# Patient Record
Sex: Male | Born: 1965 | Race: Black or African American | Hispanic: No | Marital: Single | Smoking: Never smoker
Health system: Southern US, Community
[De-identification: ages and names within clinical notes are randomized; demographics above are authoritative.]

## PROBLEM LIST (undated history)

## (undated) DIAGNOSIS — C9590 Leukemia, unspecified not having achieved remission: Secondary | ICD-10-CM

---

## 2021-01-29 ENCOUNTER — Emergency Department (HOSPITAL_COMMUNITY)
Admission: EM | Admit: 2021-01-29 | Discharge: 2021-01-30 | Disposition: A | Discharge status: Home / Self Care | Payer: BLUE CROSS/BLUE SHIELD | Attending: Emergency Medicine | Admitting: Emergency Medicine

## 2021-01-29 DIAGNOSIS — M545 Low back pain, unspecified: Secondary | ICD-10-CM | POA: Diagnosis not present

## 2021-01-29 DIAGNOSIS — M25532 Pain in left wrist: Secondary | ICD-10-CM
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
## 2021-01-30 ENCOUNTER — Other Ambulatory Visit: Payer: Self-pay

## 2021-01-30 ENCOUNTER — Emergency Department (HOSPITAL_COMMUNITY): Payer: BLUE CROSS/BLUE SHIELD
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
## 2021-01-31 ENCOUNTER — Other Ambulatory Visit: Payer: Self-pay

## 2021-01-31 ENCOUNTER — Emergency Department (HOSPITAL_BASED_OUTPATIENT_CLINIC_OR_DEPARTMENT_OTHER)
Admission: EM | Admit: 2021-01-31 | Discharge: 2021-02-01 | Disposition: A | Discharge status: Home / Self Care | Payer: BLUE CROSS/BLUE SHIELD | Attending: Emergency Medicine | Admitting: Emergency Medicine

## 2021-01-31 ENCOUNTER — Encounter (HOSPITAL_BASED_OUTPATIENT_CLINIC_OR_DEPARTMENT_OTHER): Payer: Self-pay

## 2021-01-31 DIAGNOSIS — M545 Low back pain, unspecified: Secondary | ICD-10-CM | POA: Diagnosis present

## 2021-01-31 DIAGNOSIS — Z856 Personal history of leukemia: Secondary | ICD-10-CM | POA: Insufficient documentation

## 2021-01-31 HISTORY — DX: Leukemia, unspecified not having achieved remission: C95.90

## 2021-01-31 MED ORDER — KETOROLAC TROMETHAMINE 30 MG/ML IJ SOLN
30.0000 mg | Freq: Once | INTRAMUSCULAR | Status: DC
  Filled 2021-01-31: qty 1

## 2021-01-31 MED ORDER — KETOROLAC TROMETHAMINE 30 MG/ML IJ SOLN
30.0000 mg | Freq: Once | INTRAMUSCULAR | Status: AC
  Administered 2021-01-31: 30 mg via INTRAMUSCULAR

## 2021-01-31 MED ORDER — METHOCARBAMOL 500 MG PO TABS
500.0000 mg | ORAL_TABLET | Freq: Once | ORAL | Status: AC
  Administered 2021-01-31: 500 mg via ORAL
  Filled 2021-01-31: qty 1

## 2021-01-31 NOTE — ED Notes (Signed)
ED Provider at bedside. MD

## 2021-01-31 NOTE — ED Triage Notes (Signed)
Pt. States his back is "locked up" Bilat low back pain while at the grocery store tonight. Has Hx of previous back injury.

## 2021-02-01 NOTE — ED Provider Notes (Signed)
[facility] EMERGENCY DEPARTMENT Provider Note   CSN: [id number] Arrival date & time: 01/31/21  2337     History Chief Complaint  Patient presents with   Back Pain    Alvin Finley is a 55 y.o. male.   Back Pain Location:  Generalized Quality:  Aching Radiates to:  Does not radiate Pain severity:  Moderate Duration:  2 months Timing:  Intermittent Progression:  Waxing and waning Chronicity:  Chronic Context: not emotional stress   Relieved by:  Nothing Worsened by:  Nothing Ineffective treatments:  None tried     Past Medical History:  Diagnosis Date   Leukemia ([place])     There are no problems to display for this patient.   History reviewed. No pertinent surgical history.     No family history on file.  Social History   Tobacco Use   Smoking status: Never  Substance Use Topics   Alcohol use: Yes   Drug use: Not Currently    Home Medications Prior to Admission medications   Not on File    Allergies    Patient has no known allergies.  Review of Systems   Review of Systems  Musculoskeletal:  Positive for back pain.  All other systems reviewed and are negative.  Physical Exam Updated Vital Signs BP (!) 132/97   Pulse (!) 119   Temp 98.7 F (37.1 C) (Oral)   Resp 20   Ht '5\' 6"'$  (1.676 m)   Wt 81.6 kg   SpO2 97%   BMI 29.05 kg/m   Physical Exam Vitals and nursing note reviewed.  Constitutional:      Appearance: He is well-developed.  HENT:     Head: Normocephalic and atraumatic.     Mouth/Throat:     Mouth: Mucous membranes are moist.     Pharynx: Oropharynx is clear.  Eyes:     Pupils: Pupils are equal, round, and reactive to light.  Cardiovascular:     Rate and Rhythm: Normal rate.  Pulmonary:     Effort: Pulmonary effort is normal. No respiratory distress.  Abdominal:     General: There is no distension.  Musculoskeletal:        General: No swelling or tenderness. Normal range of motion.     Cervical back:  Normal range of motion.  Skin:    General: Skin is warm and dry.     Coloration: Skin is not pale.  Neurological:     General: No focal deficit present.     Mental Status: He is alert and oriented to person, place, and time.     Cranial Nerves: No cranial nerve deficit.     Sensory: No sensory deficit.     Motor: No weakness.     Coordination: Coordination normal.     Gait: Gait normal.     Deep Tendon Reflexes: Reflexes normal.    ED Results / Procedures / Treatments   Labs (all labs ordered are listed, but only abnormal results are displayed) Labs Reviewed - No data to display  EKG None  Radiology No results found.  Procedures Procedures   Medications Ordered in ED Medications  methocarbamol (ROBAXIN) tablet 500 mg (500 mg Oral Given 01/31/21 2355)  ketorolac (TORADOL) 30 MG/ML injection 30 mg (30 mg Intramuscular Given 01/31/21 2355)    ED Course  I have reviewed the triage vital signs and the nursing notes.  Pertinent labs & imaging results that were available during my care of the patient were  reviewed by me and considered in my medical decision making (see chart for details).    MDM Rules/Calculators/A&P                           Seen at multiple ED's in last few days with similar symptoms. No red flags to indicate imaging today. Is trying to move to [place], will get PCP and follow up there when he is able.   Final Clinical Impression(s) / ED Diagnoses Final diagnoses:  Acute bilateral low back pain, unspecified whether sciatica present    Rx / DC Orders ED Discharge Orders     None        Madaline Lefeber, Corene Cornea, MD 02/01/21 0710

## 2022-10-28 ENCOUNTER — Encounter: Payer: Self-pay | Admitting: Student

## 2022-10-28 ENCOUNTER — Ambulatory Visit (INDEPENDENT_AMBULATORY_CARE_PROVIDER_SITE_OTHER): Payer: Medicaid Other | Admitting: Student

## 2022-10-28 VITALS — BP 150/91 | HR 83 | Temp 98.4°F | Ht 65.0 in | Wt 218.8 lb

## 2022-10-28 DIAGNOSIS — Z7689 Persons encountering health services in other specified circumstances: Secondary | ICD-10-CM | POA: Diagnosis not present

## 2022-10-28 DIAGNOSIS — F1990 Other psychoactive substance use, unspecified, uncomplicated: Secondary | ICD-10-CM | POA: Diagnosis not present

## 2022-10-28 DIAGNOSIS — G8929 Other chronic pain: Secondary | ICD-10-CM

## 2022-10-28 DIAGNOSIS — C9110 Chronic lymphocytic leukemia of B-cell type not having achieved remission: Secondary | ICD-10-CM | POA: Diagnosis not present

## 2022-10-28 DIAGNOSIS — Z1211 Encounter for screening for malignant neoplasm of colon: Secondary | ICD-10-CM

## 2022-10-28 DIAGNOSIS — Z789 Other specified health status: Secondary | ICD-10-CM

## 2022-10-28 DIAGNOSIS — M542 Cervicalgia: Secondary | ICD-10-CM

## 2022-10-28 DIAGNOSIS — I10 Essential (primary) hypertension: Secondary | ICD-10-CM | POA: Diagnosis not present

## 2022-10-28 MED ORDER — AMLODIPINE BESYLATE 5 MG PO TABS: 5.0000 mg | ORAL_TABLET | Freq: Every day | ORAL | 11 refills | Status: DC

## 2022-10-28 NOTE — Patient Instructions (Signed)
Thank you, [name] for allowing [place] to provide your care today. Welcome to the Internal Medicine Center.   Neck Pain -we will repeat MRI of the cervical spine to check for changes -you may try over the counter tylenol to help with the pain -try to avoid ibuprofen as much as possible  -prescription for amlodipine 5 mg one time a day for your high blood pressure -blood work today  I have ordered the following labs for you:  Lab Orders         CMP14 + Anion Gap         CBC with Diff      I have ordered the following medication/changed the following medications:   Stop the following medications: There are no discontinued medications.   Start the following medications: Meds ordered this encounter  Medications   amLODipine (NORVASC) 5 MG tablet    Sig: Take 1 tablet (5 mg total) by mouth daily.    Dispense:  30 tablet    Refill:  11     Follow up:  1 month    Should you have any questions or concerns please call the internal medicine clinic at [phone].    Rana Snare, D.O. [facility]

## 2022-10-28 NOTE — Progress Notes (Signed)
CC: establish care, chronic back pain  HPI:  Alvin Finley is a 57 y.o. male living with a history stated below and presents today for establish care and chronic back pain. Please see problem based assessment and plan for additional details.  PMH: -CLL -HTN -Bipolar disorder  PSH: -left arm fracture repair 2017  Meds: -patient states he has not been on any medications for least 2 months No current outpatient medications on file prior to visit.   No current facility-administered medications on file prior to visit.   Fhx: -mother: unknown cancer -brother: cardiovascular disease  Shx: -moved to [place] from [place] about 1-2 months ago to live with his brother -reports being on disability -able to perform ADLs -denies tobacco use, endorses EtOH use 2 shots of vodka at night, history of heroin use and current cocaine use (last use on 5/6), denies current IV drug use Social History   Socioeconomic History   Marital status: Single    Spouse name: Not on file   Number of children: Not on file   Years of education: Not on file   Highest education level: Not on file  Occupational History   Not on file  Tobacco Use   Smoking status: Never   Smokeless tobacco: Not on file  Substance and Sexual Activity   Alcohol use: Yes   Drug use: Not Currently   Sexual activity: Yes  Other Topics Concern   Not on file  Social History Narrative   Not on file   Social Determinants of Health   Financial Resource Strain: Not on file  Food Insecurity: No Food Insecurity (10/28/2022)   Hunger Vital Sign    Worried About Running Out of Food in the Last Year: Never true    Ran Out of Food in the Last Year: Never true  Transportation Needs: No Transportation Needs (10/28/2022)   PRAPARE - Administrator, Civil Service (Medical): No    Lack of Transportation (Non-Medical): No  Physical Activity: Not on file  Stress: Not on file  Social Connections: Not on file  Intimate  Partner Violence: Not At Risk (10/28/2022)   Humiliation, Afraid, Rape, and Kick questionnaire    Fear of Current or Ex-Partner: No    Emotionally Abused: No    Physically Abused: No    Sexually Abused: No   Review of Systems: ROS negative except for what is noted on the assessment and plan.  Vitals:   10/28/22 1340 10/28/22 1428  BP: (!) 152/96 (!) 150/91  Pulse: 93 83  Temp: 98.4 F (36.9 C)   TempSrc: Oral   SpO2: 99%   Weight: 218 lb 12.8 oz (99.2 kg)   Height: 5\' 5"  (1.651 m)    Physical Exam: Constitutional: alert, sitting in chair comfortably, in no acute distress Neck: supple, normal ROM, tight musculature and mild tenderness of posterior neck and upper back Cardiovascular: regular rate and rhythm Pulmonary/Chest: normal work of breathing on room air, lungs clear to auscultation bilaterally Neurological: awake and alert Skin: warm and dry Psych: pleasant mood  Assessment & Plan:   CLL (chronic lymphocytic leukemia) (HCC) Patient reports history of CLL and not on any current therapy at this time. Note from 2018 at [facility] confirmed CLL and "Cytogenetics demonstrates multiple abnormalities: trisomy 12 and deletion of 13q which carry a favorable prognosis, and a deletion of ATM/11q which carries a high risk prognosis". On chart review, unclear how well this has been followed up on  by the patient. Was seen by hematology/oncology in [place] in 2022 for one visit. Last CBC on chart review was 06/2022 with WBC of 120. Today, he feels well besides his chronic pain. Denies any fever, chills, recent weight loss or appetite changes. He would benefit from hematology/oncology referral to continue follow-up. Patient is agreeable to re-establishing care for his CLL.  Plan -CBC today -hematology/oncology referral placed   Chronic pain Patient reports chronic neck and upper back pain since a work incident involving a forklift in 2017. He states forklift fell and caused  his injuries including crush injury of his left forearm. Pain is fairly constant and aching of neck and upper back. Denies at this time any radiculopathy of b/l UE. States he is not taking any medications for the chronic pain. Chart review shows hx of gabapentin but patient unclear if it helped. Able to do his daily activities. Endorses cocaine use with history of heroin use as well. Reports previously on suboxone but did not like the effects so stopped. Drinks 2 shots of vodka a night to help with pain and sleep. MRI of c-spine in 09/2021 showed "Again noted is posterior paraspinal muscular high signal in the cervical region, left greater than right. Although findings may be posttraumatic, infectious or inflammatory etiologies/myositis should be considered possible as well. Extensive degenerative and chronic changes throughout the cervical spine...". Given his drug use hx and the signal on c-spine MRI, we will repeat imaging.   Plan -repeat MRI of c-spine -tylenol, heating and other supportive therapy  -CMP to assess kidney function before potentially restarting gabapentin  Polysubstance use disorder Patient initially stated no current drug use but after further discussion states he smokes cocaine with last use on 5/6. Unclear of frequency of use by patient. States hx of heroin use until he had an overdose. He denies any IV drug use. Counseled patient on risks of illicit drug use.   Hypertension Repeat BP today remains elevated 150/91 from 152/96. Chart review showed he was previously on amlodipine 5 mg daily. Has not been on any medications for at least 2 months. He is willing to restart amlodipine.   Plan -restart amlodipine 5 mg daily  Encounter to establish care Patient presents today as a walk-in to establish primary care. He states he moved from [place] about 1-2 months ago to live with his brother in [place]. States he did not have a PCP in [place] and has not had regular follow ups.  Patient discussed  with Dr. Layne Benton, D.O. [facility], PGY-1 Phone: [phone] Date 10/28/2022 Time 3:17 PM

## 2022-10-29 ENCOUNTER — Telehealth: Payer: Self-pay | Admitting: *Deleted

## 2022-10-29 DIAGNOSIS — Z789 Other specified health status: Secondary | ICD-10-CM | POA: Insufficient documentation

## 2022-10-29 DIAGNOSIS — F1990 Other psychoactive substance use, unspecified, uncomplicated: Secondary | ICD-10-CM | POA: Insufficient documentation

## 2022-10-29 DIAGNOSIS — C9110 Chronic lymphocytic leukemia of B-cell type not having achieved remission: Secondary | ICD-10-CM | POA: Insufficient documentation

## 2022-10-29 DIAGNOSIS — G8929 Other chronic pain: Secondary | ICD-10-CM | POA: Insufficient documentation

## 2022-10-29 DIAGNOSIS — Z7689 Persons encountering health services in other specified circumstances: Secondary | ICD-10-CM | POA: Insufficient documentation

## 2022-10-29 LAB — CMP14 + ANION GAP

## 2022-10-29 LAB — CBC WITH DIFFERENTIAL/PLATELET
Basos: 0 %
EOS (ABSOLUTE): 0.3 10*3/uL (ref 0.0–0.4)
Eos: 0 %
MCV: 88 fL (ref 79–97)
Monocytes Absolute: 8.4 10*3/uL — ABNORMAL HIGH (ref 0.1–0.9)
Neutrophils Absolute: 6.9 10*3/uL (ref 1.4–7.0)
Neutrophils: 5 %
RBC: 5.4 x10E6/uL (ref 4.14–5.80)
RDW: 14.5 % (ref 11.6–15.4)
WBC: 130 10*3/uL (ref 3.4–10.8)

## 2022-10-29 NOTE — Assessment & Plan Note (Addendum)
Patient reports history of CLL and not on any current therapy at this time. Note from 2018 at [facility] confirmed CLL and "Cytogenetics demonstrates multiple abnormalities: trisomy 12 and deletion of 13q which carry a favorable prognosis, and a deletion of ATM/11q which carries a high risk prognosis". On chart review, unclear how well this has been followed up on by the patient. Was seen by hematology/oncology in [place] in 2022 for one visit. Last CBC on chart review was 06/2022 with WBC of 120. Today, he feels well besides his chronic pain. Denies any fever, chills, recent weight loss or appetite changes. He would benefit from hematology/oncology referral to continue follow-up. Patient is agreeable to re-establishing care for his CLL.  Plan -CBC today -hematology/oncology referral placed

## 2022-10-29 NOTE — Assessment & Plan Note (Signed)
Patient reports chronic neck and upper back pain since a work incident involving a forklift in 2017. He states forklift fell and caused his injuries including crush injury of his left forearm. Pain is fairly constant and aching of neck and upper back. Denies at this time any radiculopathy of b/l UE. States he is not taking any medications for the chronic pain. Chart review shows hx of gabapentin but patient unclear if it helped. Able to do his daily activities. Endorses cocaine use with history of heroin use as well. Reports previously on suboxone but did not like the effects so stopped. Drinks 2 shots of vodka a night to help with pain and sleep. MRI of c-spine in 09/2021 showed "Again noted is posterior paraspinal muscular high signal in the cervical region, left greater than right. Although findings may be posttraumatic, infectious or inflammatory etiologies/myositis should be considered possible as well. Extensive degenerative and chronic changes throughout the cervical spine...". Given his drug use hx and the signal on c-spine MRI, we will repeat imaging.   Plan -repeat MRI of c-spine -tylenol, heating and other supportive therapy  -CMP to assess kidney function before potentially restarting gabapentin

## 2022-10-29 NOTE — Assessment & Plan Note (Addendum)
Patient presents today as a walk-in to establish primary care. He states he moved from [place] about 1-2 months ago to live with his brother in [place]. States he did not have a PCP in [place] and has not had regular follow ups.

## 2022-10-29 NOTE — Telephone Encounter (Signed)
Received call from Karen,[facility] with critical lab value.   WBC - 130.0; collected on 5/9.(Result read back) Thanks

## 2022-10-29 NOTE — Assessment & Plan Note (Signed)
Repeat BP today remains elevated 150/91 from 152/96. Chart review showed he was previously on amlodipine 5 mg daily. Has not been on any medications for at least 2 months. He is willing to restart amlodipine.   Plan -restart amlodipine 5 mg daily

## 2022-10-29 NOTE — Assessment & Plan Note (Signed)
Patient initially stated no current drug use but after further discussion states he smokes cocaine with last use on 5/6. Unclear of frequency of use by patient. States hx of heroin use until he had an overdose. He denies any IV drug use. Counseled patient on risks of illicit drug use.

## 2022-10-30 LAB — CMP14 + ANION GAP
ALT: 25 IU/L (ref 0–44)
Albumin: 4.5 g/dL (ref 3.8–4.9)
Alkaline Phosphatase: 30 IU/L — ABNORMAL LOW (ref 44–121)
BUN: 16 mg/dL (ref 6–24)
Creatinine, Ser: 1.39 mg/dL — ABNORMAL HIGH (ref 0.76–1.27)
Glucose: 93 mg/dL (ref 70–99)
Potassium: 4.1 mmol/L (ref 3.5–5.2)
Sodium: 141 mmol/L (ref 134–144)
Total Protein: 6.7 g/dL (ref 6.0–8.5)
eGFR: 59 mL/min/{1.73_m2} — ABNORMAL LOW (ref >59)

## 2022-10-30 LAB — CBC WITH DIFFERENTIAL/PLATELET
Basophils Absolute: 0.3 10*3/uL — ABNORMAL HIGH (ref 0.0–0.2)
Hematocrit: 47.4 % (ref 37.5–51.0)
Hemoglobin: 14.5 g/dL (ref 13.0–17.7)
Immature Grans (Abs): 0 10*3/uL (ref 0.0–0.1)
Immature Granulocytes: 0 %
Lymphocytes Absolute: 114 10*3/uL — ABNORMAL HIGH (ref 0.7–3.1)
Lymphs: 89 %
MCH: 26.9 pg (ref 26.6–33.0)
MCHC: 30.6 g/dL — ABNORMAL LOW (ref 31.5–35.7)
Monocytes: 6 %
Platelets: 225 10*3/uL (ref 150–450)

## 2022-11-01 NOTE — Progress Notes (Signed)
Internal Medicine Clinic Attending  Case discussed with Dr. Sherrilee Gilles  At the time of the visit.  We reviewed the resident's history and exam and pertinent patient test results.  I agree with the assessment, diagnosis, and plan of care documented in the resident's note.   CBC resulted with WBC of 130; smudge cells and atypical lymphocytes present but no comment about blasts. On chart review, this is similar to his last cbc in care everywhere on 07/19/22 with a WBC of 120. We have placed an urgent referral to oncology. Our team will follow up.

## 2022-11-01 NOTE — Addendum Note (Signed)
Addended by: Rana Snare on: 11/01/2022 08:52 AM   Modules accepted: Orders

## 2022-11-03 ENCOUNTER — Telehealth: Payer: Self-pay

## 2022-11-03 NOTE — Telephone Encounter (Signed)
Patient called he is requesting a referral for his back pain and also a referral to a gastroenterologist.

## 2022-11-09 DIAGNOSIS — Z1211 Encounter for screening for malignant neoplasm of colon: Secondary | ICD-10-CM | POA: Insufficient documentation

## 2022-11-09 NOTE — Addendum Note (Signed)
Addended by: Rana Snare on: 11/09/2022 08:36 AM   Modules accepted: Orders

## 2022-11-23 ENCOUNTER — Ambulatory Visit (INDEPENDENT_AMBULATORY_CARE_PROVIDER_SITE_OTHER): Payer: Medicaid Other | Admitting: Student

## 2022-11-23 VITALS — BP 156/104 | HR 91 | Temp 98.4°F | Ht 65.0 in | Wt 221.0 lb

## 2022-11-23 DIAGNOSIS — I10 Essential (primary) hypertension: Secondary | ICD-10-CM

## 2022-11-23 DIAGNOSIS — G8921 Chronic pain due to trauma: Secondary | ICD-10-CM

## 2022-11-23 DIAGNOSIS — C9110 Chronic lymphocytic leukemia of B-cell type not having achieved remission: Secondary | ICD-10-CM | POA: Diagnosis not present

## 2022-11-23 MED ORDER — METHOCARBAMOL 750 MG PO TABS
750.0000 mg | ORAL_TABLET | Freq: Four times a day (QID) | ORAL | 1 refills | Status: DC
Start: 2022-11-23 — End: 2023-06-24

## 2022-11-23 MED ORDER — DULOXETINE HCL 30 MG PO CPEP
30.0000 mg | ORAL_CAPSULE | Freq: Every day | ORAL | 1 refills | Status: DC
Start: 2022-11-23 — End: 2022-12-17

## 2022-11-23 MED ORDER — AMLODIPINE BESYLATE 5 MG PO TABS
5.0000 mg | ORAL_TABLET | Freq: Every day | ORAL | 11 refills | Status: DC
Start: 2022-11-23 — End: 2023-06-28

## 2022-11-23 NOTE — Assessment & Plan Note (Signed)
He has a visit with Dr. Leonides Schanz on 6/6 to establish care.

## 2022-11-23 NOTE — Assessment & Plan Note (Signed)
Patient with chronic cervical pain following forklift accident in 2017. His pain has persisted despite conservative management with tylenol and heating pad. He reports significant impairment in functional status, especially on his bad days where he is needing to remain in bed due to severity of pain. He would like to avoid any opioid therapy as he has tried this in the past without much success. MRI c-spine was ordered at last visit which still has not yet been scheduled. In the meantime, will provide therapies to help him become more functional. Will start robaxin 750mg  QID along with duloxetine 30mg  daily. Will also refer him to physical therapy. He is agreeable with these therapies and hopes to become more functional.  Plan: -continue tylenol, heating pad -robaxin 750mg  QID -duloxetine 30mg  daily -referral to physical therapy -f/u in 1 month to reassess pain

## 2022-11-23 NOTE — Patient Instructions (Addendum)
Alvin Finley,  It was a pleasure seeing you in the clinic today.   For your blood pressure: please start taking amlodipine once a day. I have sent this prescription to your pharmacy. For your pain: I have prescribed duloxetine (for chronic pain) along with robaxin (muscle relaxant) to help with your pain. I have also placed a referral to physical therapy to help with exercises. Physical therapy will reach out to you to schedule an appointment. Please make sure to go to your visit with oncology on 11/25/2022 at 9AM with Dr. Leonides Schanz. Please come back to see [place] in 1 month (or sooner if needed).  Please call our clinic at [phone] if you have any questions or concerns. The best time to call is Monday-Friday from 9am-4pm, but there is someone available 24/7 at the same number. If you need medication refills, please notify your pharmacy one week in advance and they will send [place] a request.   Thank you for letting us take part in your care. We look forward to seeing you next time!

## 2022-11-23 NOTE — Assessment & Plan Note (Signed)
Patient with elevated BP in clinic, 170/101 with repeat BP 156/104. There was some confusion as he did not realize he was prescribed amlodipine for better BP control. I discussed this medication again with him and emphasized strict adherence. Ordered refill for norvasc 5mg  daily and sent to his pharmacy. Will have him f/u in 1 month for BP recheck.  Plan: -start amlodipine 5mg  daily -f/u in 1 month for BP recheck

## 2022-11-23 NOTE — Progress Notes (Signed)
   CC: f/u HTN, chronic pain  HPI:  [name] is a 57 y.o. male with history listed below presenting to the [facility] for f/u HTN, chronic pain. Please see individualized problem based charting for full HPI.  Past Medical History:  Diagnosis Date   Leukemia (HCC)     Review of Systems:  Negative aside from that listed in individualized problem based charting.  Physical Exam:  Vitals:   11/23/22 1514 11/23/22 1541  BP: (!) 170/101 (!) 156/104  Pulse: 88 91  Temp: 98.4 F (36.9 C)   TempSrc: Oral   SpO2: 97%   Weight: 221 lb (100.2 kg)   Height: 5\' 5"  (1.651 m)    Physical Exam Constitutional:      Appearance: Normal appearance. He is obese. He is not ill-appearing.  Eyes:     General: No scleral icterus.    Extraocular Movements: Extraocular movements intact.     Conjunctiva/sclera: Conjunctivae normal.     Pupils: Pupils are equal, round, and reactive to light.  Cardiovascular:     Rate and Rhythm: Normal rate and regular rhythm.     Heart sounds: Normal heart sounds. No murmur heard.    No friction rub. No gallop.  Pulmonary:     Effort: Pulmonary effort is normal.     Breath sounds: Normal breath sounds. No wheezing, rhonchi or rales.  Abdominal:     General: Bowel sounds are normal. There is no distension.     Palpations: Abdomen is soft.     Tenderness: There is no abdominal tenderness.  Musculoskeletal:     Comments: Tender to palpation over cervical spine, chronic. Mild paraspinal tenderness as well. Notable stiffness and limited cervical ROM.  Skin:    General: Skin is warm and dry.  Neurological:     Mental Status: He is alert and oriented to person, place, and time.  Psychiatric:        Mood and Affect: Mood normal.        Behavior: Behavior normal.      Assessment & Plan:   See Encounters Tab for problem based charting.  Patient discussed with Dr.  Lafonda Mosses

## 2022-11-25 ENCOUNTER — Inpatient Hospital Stay: Payer: Medicaid Other | Attending: Hematology and Oncology | Admitting: Hematology and Oncology

## 2022-11-25 ENCOUNTER — Inpatient Hospital Stay: Payer: Medicaid Other

## 2022-11-25 ENCOUNTER — Other Ambulatory Visit: Payer: Self-pay

## 2022-11-25 VITALS — BP 158/109 | HR 86 | Temp 97.7°F | Resp 20 | Wt 217.8 lb

## 2022-11-25 DIAGNOSIS — C9110 Chronic lymphocytic leukemia of B-cell type not having achieved remission: Secondary | ICD-10-CM

## 2022-11-25 DIAGNOSIS — I10 Essential (primary) hypertension: Secondary | ICD-10-CM | POA: Diagnosis not present

## 2022-11-25 LAB — CBC WITH DIFFERENTIAL (CANCER CENTER ONLY)
Abs Immature Granulocytes: 0.3 10*3/uL — ABNORMAL HIGH (ref 0.00–0.07)
Basophils Absolute: 0.1 10*3/uL (ref 0.0–0.1)
Basophils Relative: 0 %
Eosinophils Absolute: 0.2 10*3/uL (ref 0.0–0.5)
Eosinophils Relative: 0 %
HCT: 51.1 % (ref 39.0–52.0)
Hemoglobin: 15.9 g/dL (ref 13.0–17.0)
Immature Granulocytes: 0 %
Lymphocytes Relative: 89 %
Lymphs Abs: 101.7 10*3/uL — ABNORMAL HIGH (ref 0.7–4.0)
MCH: 27.4 pg (ref 26.0–34.0)
MCHC: 31.1 g/dL (ref 30.0–36.0)
MCV: 88.1 fL (ref 80.0–100.0)
Monocytes Absolute: 5.2 10*3/uL — ABNORMAL HIGH (ref 0.1–1.0)
Monocytes Relative: 5 %
Neutro Abs: 6.9 10*3/uL (ref 1.7–7.7)
Neutrophils Relative %: 6 %
Platelet Count: 196 10*3/uL (ref 150–400)
RBC: 5.8 MIL/uL (ref 4.22–5.81)
RDW: 16.9 % — ABNORMAL HIGH (ref 11.5–15.5)
Smear Review: NORMAL
WBC Count: 114.4 10*3/uL (ref 4.0–10.5)
nRBC: 0 % (ref 0.0–0.2)

## 2022-11-25 LAB — CMP (CANCER CENTER ONLY)
ALT: 17 U/L (ref 0–44)
AST: 24 U/L (ref 15–41)
Albumin: 4.7 g/dL (ref 3.5–5.0)
Alkaline Phosphatase: 27 U/L — ABNORMAL LOW (ref 38–126)
Anion gap: 6 (ref 5–15)
BUN: 16 mg/dL (ref 6–20)
CO2: 29 mmol/L (ref 22–32)
Calcium: 9.9 mg/dL (ref 8.9–10.3)
Chloride: 106 mmol/L (ref 98–111)
Creatinine: 1.46 mg/dL — ABNORMAL HIGH (ref 0.61–1.24)
GFR, Estimated: 56 mL/min — ABNORMAL LOW (ref >60)
Glucose, Bld: 105 mg/dL — ABNORMAL HIGH (ref 70–99)
Potassium: 3.9 mmol/L (ref 3.5–5.1)
Sodium: 141 mmol/L (ref 135–145)
Total Bilirubin: 0.5 mg/dL (ref 0.3–1.2)
Total Protein: 7.3 g/dL (ref 6.5–8.1)

## 2022-11-25 LAB — LACTATE DEHYDROGENASE: LDH: 220 U/L — ABNORMAL HIGH (ref 98–192)

## 2022-11-25 NOTE — Progress Notes (Signed)
[facility] [phone]   [phone]  INITIAL CONSULT NOTE  Patient Care Team: Rana Snare, DO as PCP - General  Hematological/Oncological History # Chronic Lymphocytic Leukemia Rai Stage 0  2018: [facility] noted a diagnosis of CLL with "trisomy 12 and deletion of 13q which carry a favorable prognosis, and a deletion of ATM/11q which carries a high risk prognosis"  2022: last Oncology visit in [place] 10/28/2022: WBC 130, Hgb 14.5, MCV 88, Plt 225 11/25/2022: establish care with Dr. Leonides Schanz   CHIEF COMPLAINTS/PURPOSE OF CONSULTATION:  "Chronic Lymphocytic Leukemia "  HISTORY OF PRESENTING ILLNESS:  Alvin Finley 57 y.o. male with medical history significant for CLL and hypertension who presents to establish care.  On review of the previous records Alvin Finley was seen at [facility] health in 2018 at which time he had a diagnosis of CLL.  He was noted to have numerous mutations.  He was followed by oncology with his last visit in 2022 in [place].  He reports now he is moved to [place] for the foreseeable future.  His last labs on 10/28/2022 showed white blood cell count 130, hemoglobin 14.5, MCV 88, and platelets of 225.  The patient presents today to establish care with a new hematology provider.  On exam today Alvin Finley reports he moved to [place] to live with his brother.  His brother's wife passed away and they are keeping each other company.  He reports that he is previously followed for every 6 months.  He notes that he is never had enlarged lymph nodes or enlarged spleen.  He reports his energy levels are good but he does struggle with chronic back pain.  He had a forklift crush injury in 2017 and is currently scheduled to see a pain doctor to help him with his spine.  He reports that other than elevated blood pressure he does not have any other major medical issues.  On further discussion he reports that he is a never  smoker but drinks alcohol, at least 1 drink of liquor per day.  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  He notes that his mother died of cancer (unsure what type of cancer), and his father passed away.  His father did have bilateral lower extremity amputations and drink alcohol.  He reports he is unmarried and does not have any children.  He currently denies any fevers, chills, sweats, nausea, vomiting or diarrhea.  He denies any abdominal distention or lymphadenopathy.  A full 10 point ROS is otherwise negative.  MEDICAL HISTORY:  Past Medical History:  Diagnosis Date   Leukemia (HCC)     SURGICAL HISTORY: No past surgical history on file.  SOCIAL HISTORY: Social History   Socioeconomic History   Marital status: Single    Spouse name: Not on file   Number of children: Not on file   Years of education: Not on file   Highest education level: Not on file  Occupational History   Not on file  Tobacco Use   Smoking status: Never   Smokeless tobacco: Not on file  Substance and Sexual Activity   Alcohol use: Yes   Drug use: Not Currently   Sexual activity: Yes  Other Topics Concern   Not on file  Social History Narrative   Not on file   Social Determinants of Health   Financial Resource Strain: Not  on file  Food Insecurity: No Food Insecurity (10/28/2022)   Hunger Vital Sign    Worried About Running Out of Food in the Last Year: Never true    Ran Out of Food in the Last Year: Never true  Transportation Needs: No Transportation Needs (10/28/2022)   PRAPARE - Administrator, Civil Service (Medical): No    Lack of Transportation (Non-Medical): No  Physical Activity: Not on file  Stress: Not on file  Social Connections: Not on file  Intimate Partner Violence: Not At Risk (10/28/2022)   Humiliation, Afraid, Rape, and Kick questionnaire    Fear of Current or Ex-Partner: No    Emotionally  Abused: No    Physically Abused: No    Sexually Abused: No    FAMILY HISTORY: No family history on file.  ALLERGIES:  has No Known Allergies.  MEDICATIONS:  Current Outpatient Medications  Medication Sig Dispense Refill   melatonin (MELATONIN MAXIMUM STRENGTH) 5 MG TABS Take 5 mg by mouth at bedtime.     amLODipine (NORVASC) 5 MG tablet Take 1 tablet (5 mg total) by mouth daily. 30 tablet 11   DULoxetine (CYMBALTA) 30 MG capsule Take 1 capsule (30 mg total) by mouth daily. 30 capsule 1   methocarbamol (ROBAXIN) 750 MG tablet Take 1 tablet (750 mg total) by mouth 4 (four) times daily. 120 tablet 1   No current facility-administered medications for this visit.    REVIEW OF SYSTEMS:   Constitutional: ( - ) fevers, ( - )  chills , ( - ) night sweats Eyes: ( - ) blurriness of vision, ( - ) double vision, ( - ) watery eyes Ears, nose, mouth, throat, and face: ( - ) mucositis, ( - ) sore throat Respiratory: ( - ) cough, ( - ) dyspnea, ( - ) wheezes Cardiovascular: ( - ) palpitation, ( - ) chest discomfort, ( - ) lower extremity swelling Gastrointestinal:  ( - ) nausea, ( - ) heartburn, ( - ) change in bowel habits Skin: ( - ) abnormal skin rashes Lymphatics: ( - ) new lymphadenopathy, ( - ) easy bruising Neurological: ( - ) numbness, ( - ) tingling, ( - ) new weaknesses Behavioral/Psych: ( - ) mood change, ( - ) new changes  All other systems were reviewed with the patient and are negative.  PHYSICAL EXAMINATION: ECOG PERFORMANCE STATUS: 0 - Asymptomatic  Vitals:   11/25/22 0848  BP: (!) 158/109  Pulse: 86  Resp: 20  Temp: 97.7 F (36.5 C)  SpO2: 98%   Filed Weights   11/25/22 0848  Weight: 217 lb 12.8 oz (98.8 kg)    GENERAL: well appearing middle-aged African-American male in NAD  SKIN: Skin graft on left arm with skin from left thigh.  Otherwise skin color, texture, turgor are normal, no rashes or significant lesions EYES: conjunctiva are pink and non-injected, sclera  clear LUNGS: clear to auscultation and percussion with normal breathing effort HEART: regular rate & rhythm and no murmurs and no lower extremity edema Musculoskeletal: no cyanosis of digits and no clubbing  PSYCH: alert & oriented x 3, fluent speech NEURO: no focal motor/sensory deficits  LABORATORY DATA:  I have reviewed the data as listed    Latest Ref Rng & Units 11/25/2022    9:49 AM 10/28/2022    4:16 PM  CBC  WBC 4.0 - 10.5 K/uL 114.4  130.0   Hemoglobin 13.0 - 17.0 g/dL 16.1  09.6   Hematocrit 39.0 -  52.0 % 51.1  47.4   Platelets 150 - 400 K/uL 196  225        Latest Ref Rng & Units 11/25/2022    9:49 AM 10/28/2022    4:16 PM  CMP  Glucose 70 - 99 mg/dL 409  93   BUN 6 - 20 mg/dL 16  16   Creatinine 8.11 - 1.24 mg/dL 9.14  7.82   Sodium 956 - 145 mmol/L 141  141   Potassium 3.5 - 5.1 mmol/L 3.9  4.1   Chloride 98 - 111 mmol/L 106  102   CO2 22 - 32 mmol/L 29  23   Calcium 8.9 - 10.3 mg/dL 9.9  9.7   Total Protein 6.5 - 8.1 g/dL 7.3  6.7   Total Bilirubin 0.3 - 1.2 mg/dL 0.5  <2.1   Alkaline Phos 38 - 126 U/L 27  30   AST 15 - 41 U/L 24  19   ALT 0 - 44 U/L 17  25    RADIOGRAPHIC STUDIES: No results found.  ASSESSMENT & PLAN Alvin Finley 57 y.o. male with medical history significant for CLL and hypertension who presents to establish care.  After review of the labs, review of the records, and discussion with the patient the patients findings are most consistent with CLL.  # CLL Rai Stage 0 -- Patient has established history of CLL dating back to at least 2018.  He is previously seen in [place] up until 2022. -- Will order baseline labs today to include CBC, CMP, LDH -- Will confirm diagnosis with flow cytometry as well as CLL prognostic panel, ZAP70, and IGHV --Discussed with patient the criteria necessary to start treatment.  Discussed in general treatment options including oral therapy versus infusion therapy.  -- Will plan to see patient back every 6  months in order to evaluate monitor.  No clear indication for treatment at this time.  Orders Placed This Encounter  Procedures   CBC with Differential (Cancer Center Only)    Standing Status:   Future    Number of Occurrences:   1    Standing Expiration Date:   11/25/2023   CMP (Cancer Center only)    Standing Status:   Future    Number of Occurrences:   1    Standing Expiration Date:   11/25/2023   Lactate dehydrogenase (LDH)    Standing Status:   Future    Number of Occurrences:   1    Standing Expiration Date:   11/25/2023   Flow Cytometry, Peripheral Blood (Oncology)    Standing Status:   Future    Number of Occurrences:   1    Standing Expiration Date:   11/25/2023   FISH, CLL Prognostic Panel    Standing Status:   Future    Number of Occurrences:   1    Standing Expiration Date:   11/25/2023   ZAP-70 Panel (ASR)    Standing Status:   Future    Number of Occurrences:   1    Standing Expiration Date:   11/25/2023   IgVH Somatic Hypermutation    Standing Status:   Future    Number of Occurrences:   1    Standing Expiration Date:   11/25/2023    All questions were answered. The patient knows to call the clinic with any problems, questions or concerns.  A total of more than 60 minutes were spent on this encounter with face-to-face time and non-face-to-face  time, including preparing to see the patient, ordering tests and/or medications, counseling the patient and coordination of care as outlined above.   Ulysees Barns, MD Department of Hematology/Oncology [facility] Phone: [phone] Pager: [phone] Email: [email or web address]  11/25/2022 2:01 PM  Mathis Fare BD, Catovsky D, Caligaris-Cappio F, Dighiero G, Dhner H, [place], [address], [place] E, Chiorazzi N, [place], Rai KR, [place], Eichhorst B, O'Brien S, Robak T, Seymour JF, Kipps TJ. iwCLL guidelines for diagnosis, indications for treatment, response assessment,  and supportive management of CLL. Blood. 2018 Jun 21;131(25):2745-2760.  Active disease should be clearly documented to initiate therapy. At least 1 of the following criteria should be met.  1) Evidence of progressive marrow failure as manifested by the development of, or worsening of, anemia and/or thrombocytopenia. Cutoff levels of Hb <10 g/dL or platelet counts <253  109/L are generally regarded as indication for treatment. However, in some patients, platelet counts <100  109/L may remain stable over a long period; this situation does not automatically require therapeutic intervention. 2) Massive (ie, ?6 cm below the left costal margin) or progressive or symptomatic splenomegaly. 3) Massive nodes (ie, ?10 cm in longest diameter) or progressive or symptomatic lymphadenopathy. 4) Progressive lymphocytosis with an increase of ?50% over a 8-month period, or lymphocyte doubling time (LDT) <6 months. LDT can be obtained by linear regression extrapolation of absolute lymphocyte counts obtained at intervals of 2 weeks over an observation period of 2 to 3 months; patients with initial blood lymphocyte counts <30  109/L may require a longer observation period to determine the LDT. Factors contributing to lymphocytosis other than CLL (eg, infections, steroid administration) should be excluded. 5) Autoimmune complications including anemia or thrombocytopenia poorly responsive to corticosteroids. 6) Symptomatic or functional extranodal involvement (eg, skin, kidney, lung, spine). Disease-related symptoms as defined by any of the following: Unintentional weight loss ?10% within the previous 6 months. Significant fatigue (ie, ECOG performance scale 2 or worse; cannot work or unable to perform usual activities). Fevers ?100.69F or 38.0C for 2 or more weeks without evidence of infection. Night sweats for ?1 month without evidence of infection.

## 2022-11-25 NOTE — Progress Notes (Signed)
CRITICAL VALUE STICKER  CRITICAL VALUE:WBC: 114.4  DATE & TIME NOTIFIED: 11/25/22  10:10  MD NOTIFIED: Leonides Schanz  TIME OF NOTIFICATION: 10:14

## 2022-11-26 ENCOUNTER — Telehealth: Payer: Self-pay | Admitting: Hematology and Oncology

## 2022-11-26 LAB — SURGICAL PATHOLOGY

## 2022-11-27 LAB — FLOW CYTOMETRY

## 2022-11-29 NOTE — Addendum Note (Signed)
Addended by: Derrek Monaco on: 11/29/2022 08:53 AM   Modules accepted: Level of Service

## 2022-11-29 NOTE — Progress Notes (Signed)
Internal Medicine Clinic Attending  Case discussed with Dr. Jinwala  At the time of the visit.  We reviewed the resident's history and exam and pertinent patient test results.  I agree with the assessment, diagnosis, and plan of care documented in the resident's note.  

## 2022-11-30 LAB — ZAP-70 PANEL (ASR)

## 2022-12-01 LAB — FISH,CLL PROGNOSTIC PANEL

## 2022-12-02 LAB — IGVH SOMATIC HYPERMUTATION

## 2022-12-17 ENCOUNTER — Other Ambulatory Visit: Payer: Self-pay | Admitting: Student

## 2022-12-17 DIAGNOSIS — G8921 Chronic pain due to trauma: Secondary | ICD-10-CM

## 2023-01-27 ENCOUNTER — Ambulatory Visit: Payer: Medicaid Other | Admitting: Physical Therapy

## 2023-02-03 ENCOUNTER — Ambulatory Visit: Payer: Medicaid Other | Admitting: Physical Therapy

## 2023-02-03 ENCOUNTER — Encounter: Payer: Self-pay | Admitting: Physical Therapy

## 2023-02-03 VITALS — BP 167/107 | HR 93

## 2023-02-03 DIAGNOSIS — M542 Cervicalgia: Secondary | ICD-10-CM

## 2023-02-03 NOTE — Therapy (Signed)
OUTPATIENT PHYSICAL THERAPY CERVICAL EVALUATION - ARRIVED NO CHARGE   Patient Name: Alvin Finley MRN: [id number] DOB:November 24, 1965, 57 y.o., male Today's Date: 02/03/2023  END OF SESSION:  PT End of Session - 02/03/23 1345     Visit Number 1   arrived no charge   PT Start Time 1318    PT Stop Time 1330    PT Time Calculation (min) 12 min    Activity Tolerance --   elevated BP            Past Medical History:  Diagnosis Date   Leukemia (HCC)    History reviewed. No pertinent surgical history. Patient Active Problem List   Diagnosis Date Noted   Screening for colorectal cancer 11/09/2022   Polysubstance use disorder 10/29/2022   Alcohol use 10/29/2022   CLL (chronic lymphocytic leukemia) (HCC) 10/29/2022   Chronic pain 10/29/2022   Encounter to establish care 10/29/2022   Hypertension 10/28/2022    PCP: Rana Snare, DO   REFERRING PROVIDER: Reymundo Poll, MD   REFERRING DIAG: G89.21 (ICD-10-CM) - Chronic pain due to trauma   THERAPY DIAG:  Cervicalgia  Rationale for Evaluation and Treatment: Rehabilitation  ONSET DATE: 11/23/2022  SUBJECTIVE:                                                                                                                                                                                                         SUBJECTIVE STATEMENT: Has pain in back and sometimes will move arm and feels like someone is sticking a knife in his head. Mainly has pain in his mid back. Will sometimes have pain in his neck. 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 Tried a number of pain pills, but nothing helps. Has only had PT for his arm, but none for his neck   Hand dominance: Left  PERTINENT HISTORY:  PMH: HTN, CLL  Patient with chronic cervical pain following forklift accident in 2017. His pain has persisted despite conservative management with tylenol and heating pad.    PAIN:  Are you having pain? Yes: NPRS  scale: 3-4/10 Pain location: Mid back  Pain description: Aching  Aggravating factors: Sitting up for a while Relieving factors: Sometimes when he is sitting still he won't feel it, otherwise nothing    Vitals:   02/03/23 1328  BP: (!) 167/107  Pulse: 93     PRECAUTIONS: None  RED FLAGS: None     WEIGHT BEARING RESTRICTIONS: No  FALLS:  Has patient fallen in last 6 months? No   OCCUPATION:  Not working   PLOF: Independent  PATIENT GOALS: Wants to get rid of the pain, have more mobility    Pt with elevated BP outside of safe parameters for therapy. Pt asymptomatic and reports that he did not take his BP medication this morning. Pt brother also present with him and needs to leave 15 minutes early. Discussed will reschedule pt at this time for full time for eval and when his BP is WNL and pt takes his BP medication. Pt and pt's brother verbalized understanding and were going to re-schedule   Drake Leach, PT, DPT 02/03/2023, 1:48 PM

## 2023-02-10 ENCOUNTER — Ambulatory Visit (HOSPITAL_COMMUNITY)
Admission: RE | Admit: 2023-02-10 | Discharge: 2023-02-10 | Disposition: A | Discharge status: Home / Self Care | Payer: Medicaid Other | Source: Ambulatory Visit | Attending: Student in an Organized Health Care Education/Training Program | Admitting: Student in an Organized Health Care Education/Training Program

## 2023-02-10 DIAGNOSIS — M542 Cervicalgia: Secondary | ICD-10-CM | POA: Insufficient documentation

## 2023-02-14 ENCOUNTER — Ambulatory Visit: Payer: Medicaid Other | Admitting: Physical Therapy

## 2023-03-08 ENCOUNTER — Ambulatory Visit: Payer: Medicaid Other

## 2023-03-30 ENCOUNTER — Encounter: Payer: Medicaid Other | Admitting: Student

## 2023-03-30 NOTE — Progress Notes (Deleted)
Established Patient Office Visit  Subjective   Patient ID: Alvin Finley, male    DOB: 10/26/1965  Age: 57 y.o. MRN: [id number]  No chief complaint on file.   HPI  {History (Optional):23778}  ROS    Objective:     There were no vitals taken for this visit. {Vitals History (Optional):23777}  Physical Exam   No results found for any visits on 03/30/23.  {Labs (Optional):23779}  The ASCVD Risk score (Arnett DK, et al., 2019) failed to calculate for the following reasons:   The valid HDL cholesterol range is 20 to 100 mg/dL    Assessment & Plan:  Chronic back pain  Patient with chronic cervical pain following forklift accident in 2017. His pain has persisted despite conservative management with tylenol and heating pad.  Had appointment with outpatient PT, did not evaluate due to elevated BP. MRI of  MRI of the cervical spine wo contrast 02/10/2023 that showed:   IMPRESSION: 1. Cervical spondylosis as outlined. 2. At C3-C4, a partially calcified central disc protrusion effaces the ventral thecal sac (mild-to-moderately flattening the ventralaspect of the spinal cord). However, the dorsal CSF space is maintained within the spinal canal. Multifactorial bilateral neural foraminal narrowing (mild right, mild-to-moderate left). 3. At C4-C5, a left disc protrusion effaces the ventral thecal sac and mild-to-moderately flattens the ventral aspect of spinal cord. However, the dorsal CSF space is maintained within the spinal canal. 4. At C5-C6, a disc bulge effaces the ventral thecal sac and mildly flattens the ventral aspect of spinal cord. However, the dorsal CSF space is maintained within the spinal canal. Multifactorial moderate-to-severe left neural foraminal narrowing. 5. No significant spinal canal stenosis at the remaining levels. 6. Additional sites of foraminal stenosis, greatest on the right at C6-C7 (severe at this site). 7. Disc degeneration is greatest at C3-C4 and  C6-C7 (moderate-to-advanced at these levels). 8. Facet arthrosis at C2-C3 and C7-T1. 9. Bulky ventral osteophytes throughout the cervical spine, some bridging. 10. Levocurvature of the cervical spine. 11. 2 mm C2-C3 grade 1 anterolisthesis.   Plan: -continue tylenol, heating pad -robaxin 750mg  QID -duloxetine 30mg  daily - physical therapy  Chronic Lymphocytic Leukemia dx 2018 Note from 2018 at [facility] confirmed CLL and "Cytogenetics demonstrates multiple abnormalities: trisomy 12 and deletion of 13q which carry a favorable prognosis, and a deletion of ATM/11q which carries a high risk prognosis".  Follows Dr. Leonides Schanz from oncology  HTN BP Readings from Last 3 Encounters:  02/03/23 (!) 167/107  11/25/22 (!) 158/109  11/23/22 (!) 156/104         Problem List Items Addressed This Visit   None   No follow-ups on file.    Jeral Pinch, DO

## 2023-04-06 ENCOUNTER — Telehealth: Payer: Self-pay

## 2023-04-06 NOTE — Transitions of Care (Post Inpatient/ED Visit) (Signed)
04/06/2023  Name: Alvin Finley MRN: [id number] DOB: 03-03-1966  Today's TOC FU Call Status: Today's TOC FU Call Status:: Unsuccessful Call (1st Attempt) Unsuccessful Call (1st Attempt) Date: 04/06/23  Attempted to reach the patient regarding the most recent Inpatient/ED visit.  Follow Up Plan: Additional outreach attempts will be made to reach the patient to complete the Transitions of Care (Post Inpatient/ED visit) call.   Jodelle Gross RN, BSN, CCM RN Care Manager  Transitions of Care  [facility] - [facility]  [phone]

## 2023-05-14 ENCOUNTER — Telehealth: Payer: Self-pay | Admitting: Hematology and Oncology

## 2023-05-23 NOTE — Progress Notes (Unsigned)
Patient Care Team: Rana Snare, DO as PCP - General   CHIEF COMPLAINT: Follow up CLL  Oncology History  CLL (chronic lymphocytic leukemia) (HCC)  10/29/2022 Initial Diagnosis   CLL (chronic lymphocytic leukemia) (HCC)   11/25/2022 Cancer Staging   Staging form: Chronic Lymphocytic Leukemia / Small Lymphocytic Lymphoma, AJCC 8th Edition - Clinical stage from 11/25/2022: Modified Rai Stage 0 (Modified Rai risk: Low, Binet: Stage A, Lymphocytosis: Present, Adenopathy: Absent, Organomegaly: Absent, Anemia: Absent, Thrombocytopenia: Absent) - Signed by Jaci Standard, MD on 11/25/2022 Stage prefix: Initial diagnosis Absolute lymphocyte count (ALC) (cells/uL): 102      CURRENT THERAPY: Surveillance   INTERVAL HISTORY Alvin Finley returns for follow up as scheduled. Last seen by Dr. Leonides Schanz 11/25/22  ROS   Past Medical History:  Diagnosis Date   Leukemia (HCC)      No past surgical history on file.   Outpatient Encounter Medications as of 05/24/2023  Medication Sig   amLODipine (NORVASC) 5 MG tablet Take 1 tablet (5 mg total) by mouth daily.   DULoxetine (CYMBALTA) 30 MG capsule TAKE 1 CAPSULE BY MOUTH EVERY DAY   melatonin (MELATONIN MAXIMUM STRENGTH) 5 MG TABS Take 5 mg by mouth at bedtime.   methocarbamol (ROBAXIN) 750 MG tablet Take 1 tablet (750 mg total) by mouth 4 (four) times daily.   No facility-administered encounter medications on file as of 05/24/2023.     There were no vitals filed for this visit. There is no height or weight on file to calculate BMI.   PHYSICAL EXAM GENERAL:alert, no distress and comfortable SKIN: no rash  EYES: sclera clear NECK: without mass LYMPH:  no palpable cervical or supraclavicular lymphadenopathy  LUNGS: clear with normal breathing effort HEART: regular rate & rhythm, no lower extremity edema ABDOMEN: abdomen soft, non-tender and normal bowel sounds NEURO: alert & oriented x 3 with fluent speech, no focal motor/sensory  deficits Breast exam:  PAC without erythema    CBC    Component Value Date/Time   WBC 114.4 (HH) 11/25/2022 0949   RBC 5.80 11/25/2022 0949   HGB 15.9 11/25/2022 0949   HGB 14.5 10/28/2022 1616   HCT 51.1 11/25/2022 0949   HCT 47.4 10/28/2022 1616   PLT 196 11/25/2022 0949   PLT 225 10/28/2022 1616   MCV 88.1 11/25/2022 0949   MCV 88 10/28/2022 1616   MCH 27.4 11/25/2022 0949   MCHC 31.1 11/25/2022 0949   RDW 16.9 (H) 11/25/2022 0949   RDW 14.5 10/28/2022 1616   LYMPHSABS 101.7 (H) 11/25/2022 0949   LYMPHSABS 114.0 (H) 10/28/2022 1616   MONOABS 5.2 (H) 11/25/2022 0949   EOSABS 0.2 11/25/2022 0949   EOSABS 0.3 10/28/2022 1616   BASOSABS 0.1 11/25/2022 0949   BASOSABS 0.3 (H) 10/28/2022 1616     CMP     Component Value Date/Time   NA 141 11/25/2022 0949   NA 141 10/28/2022 1616   K 3.9 11/25/2022 0949   CL 106 11/25/2022 0949   CO2 29 11/25/2022 0949   GLUCOSE 105 (H) 11/25/2022 0949   BUN 16 11/25/2022 0949   BUN 16 10/28/2022 1616   CREATININE 1.46 (H) 11/25/2022 0949   CALCIUM 9.9 11/25/2022 0949   PROT 7.3 11/25/2022 0949   PROT 6.7 10/28/2022 1616   ALBUMIN 4.7 11/25/2022 0949   ALBUMIN 4.5 10/28/2022 1616   AST 24 11/25/2022 0949   ALT 17 11/25/2022 0949   ALKPHOS 27 (L) 11/25/2022 0949   BILITOT 0.5 11/25/2022  4098   GFRNONAA 56 (L) 11/25/2022 0949     ASSESSMENT & PLAN: 57 year old male  Chronic lymphocytic leukemia RAI stage 0 -Dr. Derek Mound initial note:  -2018: [facility] noted a diagnosis of CLL with "trisomy 12 and deletion of 13q which carry a favorable prognosis, and a deletion of ATM/11q which carries a high risk prognosis"  -2022: last Oncology [place] in [place] -11/25/2022: establish care with Dr. Leonides Schanz  -Additional studies showed monoclonal B-cell population favoring CLL, trisomy +12 associated with an intermediate prognosis (no evidence of p53, 13 q. or ATM deletion) ZAP-70 positive, CD38 negative, CD4 90 negative; elevated  LDH 220  PLAN:  No orders of the defined types were placed in this encounter.     All questions were answered. The patient knows to call the clinic with any problems, questions or concerns. No barriers to learning were detected. I spent *** counseling the patient face to face. The total time spent in the appointment was *** and more than 50% was on counseling, review of test results, and coordination of care.   Alvin Glad, NP-C @DATE @

## 2023-05-24 ENCOUNTER — Other Ambulatory Visit: Payer: Self-pay | Admitting: Nurse Practitioner

## 2023-05-24 ENCOUNTER — Inpatient Hospital Stay: Payer: Medicaid Other | Admitting: Nurse Practitioner

## 2023-05-24 ENCOUNTER — Inpatient Hospital Stay: Payer: Medicaid Other

## 2023-05-24 DIAGNOSIS — C9110 Chronic lymphocytic leukemia of B-cell type not having achieved remission: Secondary | ICD-10-CM

## 2023-05-26 ENCOUNTER — Ambulatory Visit: Payer: Medicaid Other | Admitting: Hematology and Oncology

## 2023-05-26 ENCOUNTER — Other Ambulatory Visit: Payer: Medicaid Other

## 2023-06-05 NOTE — Progress Notes (Deleted)
Patient Care Team: Rana Snare, DO as PCP - General   CHIEF COMPLAINT: Follow up CLL  Oncology History  CLL (chronic lymphocytic leukemia) (HCC)  10/29/2022 Initial Diagnosis   CLL (chronic lymphocytic leukemia) (HCC)   11/25/2022 Cancer Staging   Staging form: Chronic Lymphocytic Leukemia / Small Lymphocytic Lymphoma, AJCC 8th Edition - Clinical stage from 11/25/2022: Modified Rai Stage 0 (Modified Rai risk: Low, Binet: Stage A, Lymphocytosis: Present, Adenopathy: Absent, Organomegaly: Absent, Anemia: Absent, Thrombocytopenia: Absent) - Signed by Jaci Standard, MD on 11/25/2022 Stage prefix: Initial diagnosis Absolute lymphocyte count (ALC) (cells/uL): 102      CURRENT THERAPY: Surveillance   INTERVAL HISTORY Alvin Finley returns for follow up as scheduled. Last seen by Dr. Leonides Schanz 11/25/22  ROS   Past Medical History:  Diagnosis Date   Leukemia (HCC)      No past surgical history on file.   Outpatient Encounter Medications as of 06/07/2023  Medication Sig   amLODipine (NORVASC) 5 MG tablet Take 1 tablet (5 mg total) by mouth daily.   DULoxetine (CYMBALTA) 30 MG capsule TAKE 1 CAPSULE BY MOUTH EVERY DAY   melatonin (MELATONIN MAXIMUM STRENGTH) 5 MG TABS Take 5 mg by mouth at bedtime.   methocarbamol (ROBAXIN) 750 MG tablet Take 1 tablet (750 mg total) by mouth 4 (four) times daily.   No facility-administered encounter medications on file as of 06/07/2023.     There were no vitals filed for this visit. There is no height or weight on file to calculate BMI.   PHYSICAL EXAM GENERAL:alert, no distress and comfortable SKIN: no rash  EYES: sclera clear NECK: without mass LYMPH:  no palpable cervical or supraclavicular lymphadenopathy  LUNGS: clear with normal breathing effort HEART: regular rate & rhythm, no lower extremity edema ABDOMEN: abdomen soft, non-tender and normal bowel sounds NEURO: alert & oriented x 3 with fluent speech, no focal motor/sensory  deficits Breast exam:  PAC without erythema    CBC    Component Value Date/Time   WBC 114.4 (HH) 11/25/2022 0949   RBC 5.80 11/25/2022 0949   HGB 15.9 11/25/2022 0949   HGB 14.5 10/28/2022 1616   HCT 51.1 11/25/2022 0949   HCT 47.4 10/28/2022 1616   PLT 196 11/25/2022 0949   PLT 225 10/28/2022 1616   MCV 88.1 11/25/2022 0949   MCV 88 10/28/2022 1616   MCH 27.4 11/25/2022 0949   MCHC 31.1 11/25/2022 0949   RDW 16.9 (H) 11/25/2022 0949   RDW 14.5 10/28/2022 1616   LYMPHSABS 101.7 (H) 11/25/2022 0949   LYMPHSABS 114.0 (H) 10/28/2022 1616   MONOABS 5.2 (H) 11/25/2022 0949   EOSABS 0.2 11/25/2022 0949   EOSABS 0.3 10/28/2022 1616   BASOSABS 0.1 11/25/2022 0949   BASOSABS 0.3 (H) 10/28/2022 1616     CMP     Component Value Date/Time   NA 141 11/25/2022 0949   NA 141 10/28/2022 1616   K 3.9 11/25/2022 0949   CL 106 11/25/2022 0949   CO2 29 11/25/2022 0949   GLUCOSE 105 (H) 11/25/2022 0949   BUN 16 11/25/2022 0949   BUN 16 10/28/2022 1616   CREATININE 1.46 (H) 11/25/2022 0949   CALCIUM 9.9 11/25/2022 0949   PROT 7.3 11/25/2022 0949   PROT 6.7 10/28/2022 1616   ALBUMIN 4.7 11/25/2022 0949   ALBUMIN 4.5 10/28/2022 1616   AST 24 11/25/2022 0949   ALT 17 11/25/2022 0949   ALKPHOS 27 (L) 11/25/2022 0949   BILITOT 0.5 11/25/2022  2130   GFRNONAA 56 (L) 11/25/2022 0949     ASSESSMENT & PLAN: 57 year old male  Chronic lymphocytic leukemia RAI stage 0 -Dr. Derek Mound initial note:  -2018: [facility] noted a diagnosis of CLL with "trisomy 12 and deletion of 13q which carry a favorable prognosis, and a deletion of ATM/11q which carries a high risk prognosis"  -2022: last Oncology visit in [place] -11/25/2022: establish care with Dr. Leonides Schanz  -Additional studies showed monoclonal B-cell population favoring CLL, trisomy +12 associated with an intermediate prognosis (no evidence of p53, 13 q. or ATM deletion) ZAP-70 positive, CD38 negative, CD4 90 negative; elevated  LDH 220  PLAN:  No orders of the defined types were placed in this encounter.     All questions were answered. The patient knows to call the clinic with any problems, questions or concerns. No barriers to learning were detected. I spent *** counseling the patient face to face. The total time spent in the appointment was *** and more than 50% was on counseling, review of test results, and coordination of care.   Alvin Glad, NP-C @DATE @

## 2023-06-07 ENCOUNTER — Inpatient Hospital Stay: Payer: Medicaid Other | Attending: Nurse Practitioner

## 2023-06-07 ENCOUNTER — Telehealth: Payer: Self-pay

## 2023-06-07 ENCOUNTER — Inpatient Hospital Stay: Payer: Medicaid Other | Admitting: Nurse Practitioner

## 2023-06-07 NOTE — Telephone Encounter (Signed)
Pt missed today's appt due to moving to [facility] and lack of transportation.  Referred to scheduling to reschedule

## 2023-06-08 ENCOUNTER — Other Ambulatory Visit: Payer: Self-pay | Admitting: Student

## 2023-06-08 DIAGNOSIS — M542 Cervicalgia: Secondary | ICD-10-CM

## 2023-06-08 DIAGNOSIS — G8929 Other chronic pain: Secondary | ICD-10-CM

## 2023-06-09 ENCOUNTER — Other Ambulatory Visit: Payer: Self-pay

## 2023-06-12 IMAGING — CR DG WRIST COMPLETE 3+V*L*
5 series · 5 of 5 positions shown · non-contrast
Comparison: None.

CLINICAL DATA: Wrist pain, injury

EXAM:
LEFT WRIST - COMPLETE 3+ VIEW

[wrist pa]
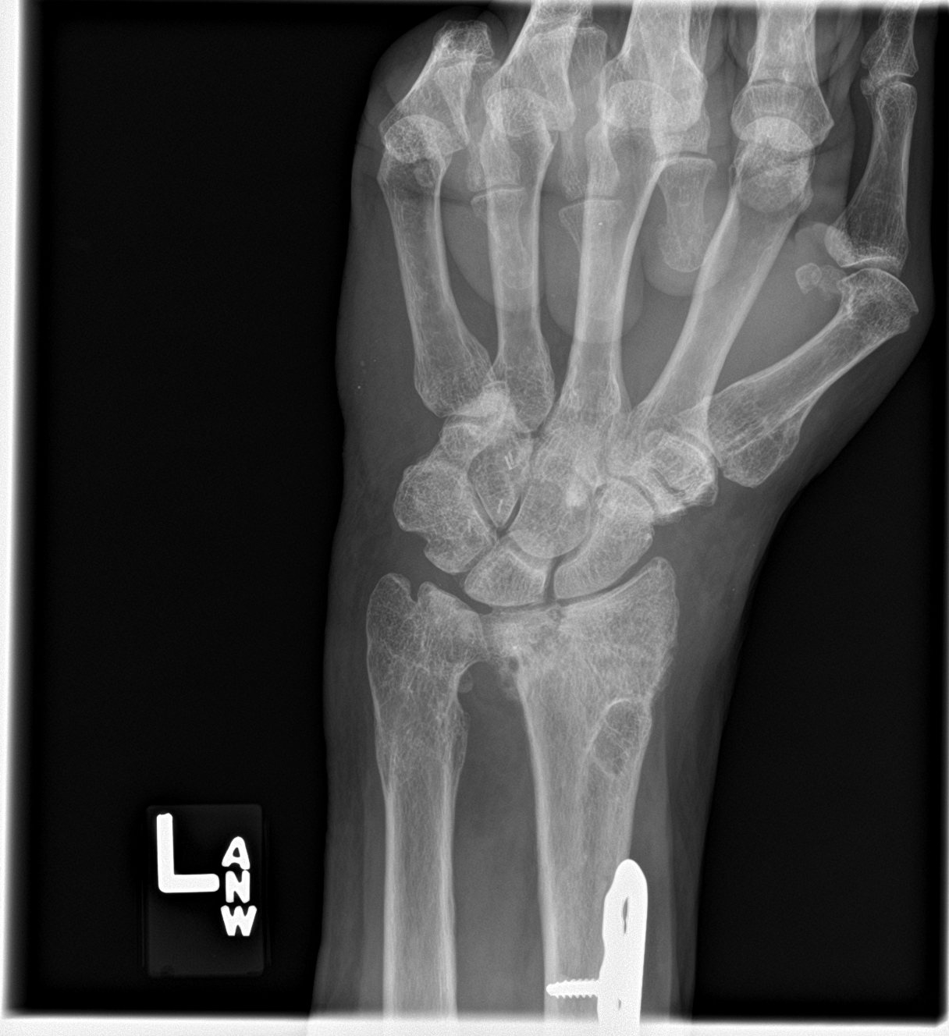

[wrist obl]
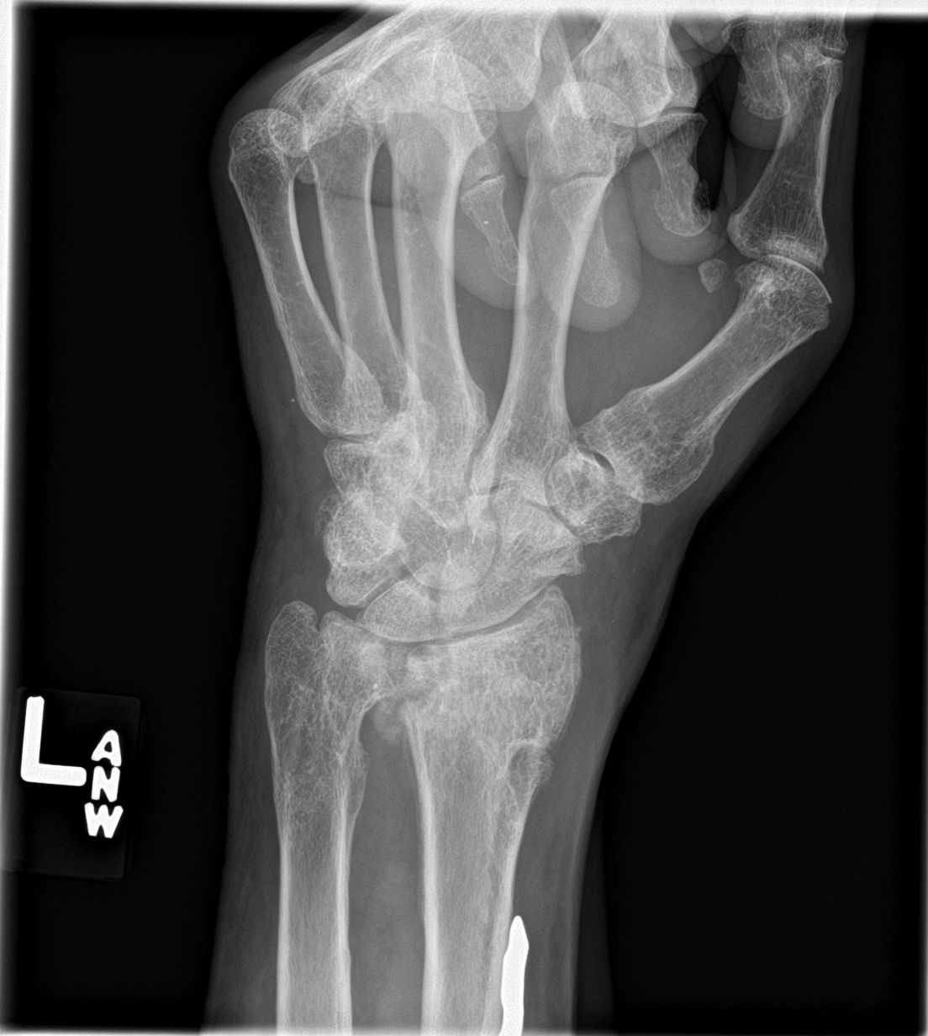

[wrist navicular (1 of 2)]
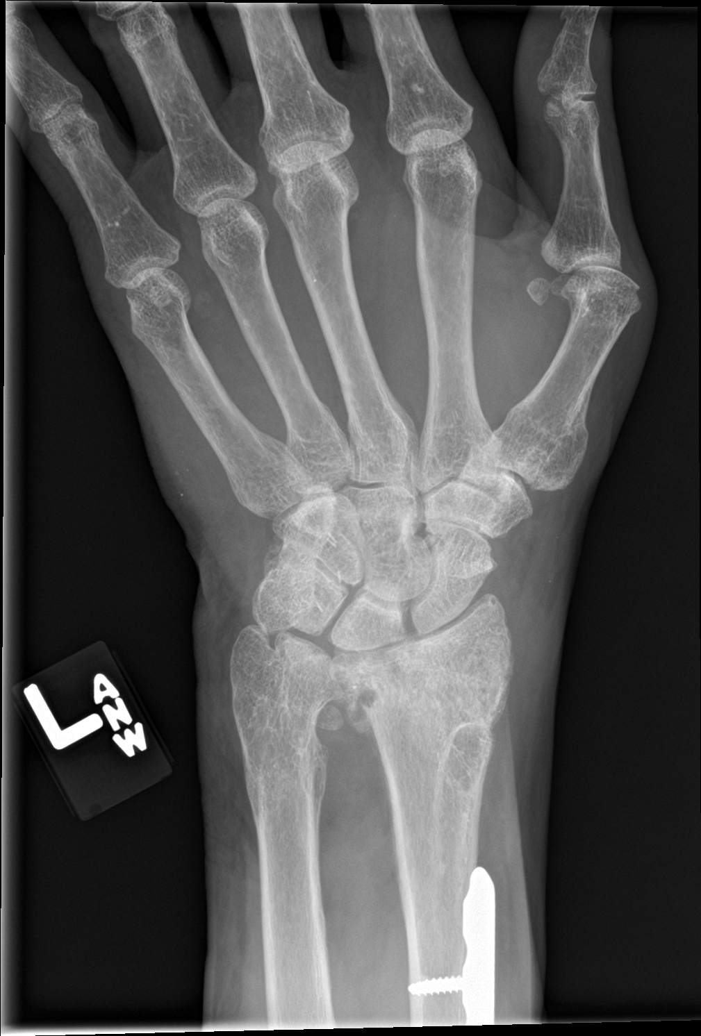

[wrist lat]
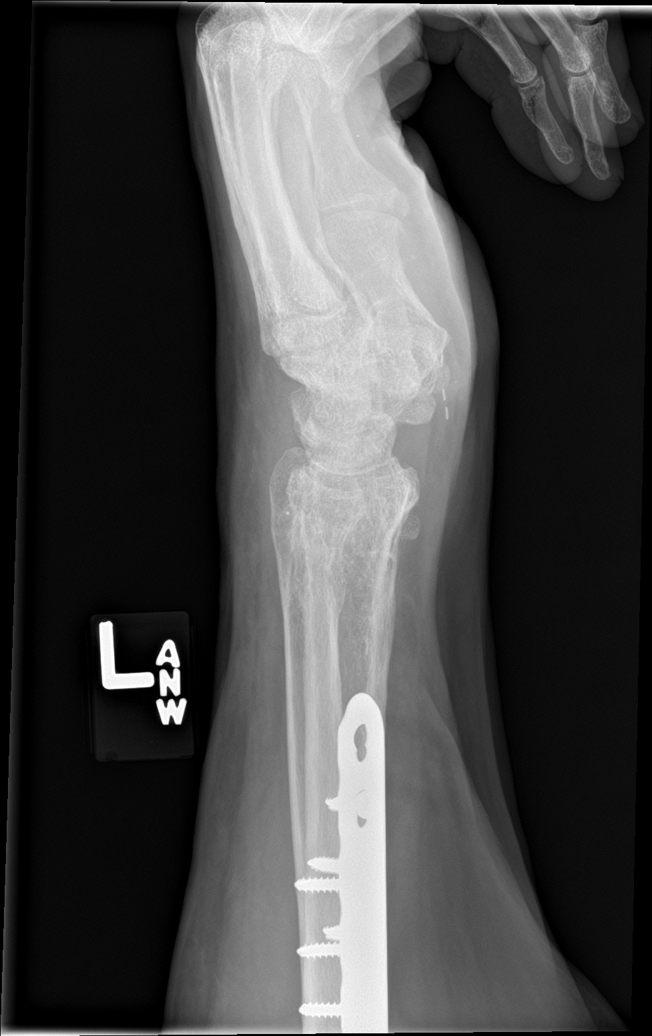

[wrist navicular (2 of 2)]
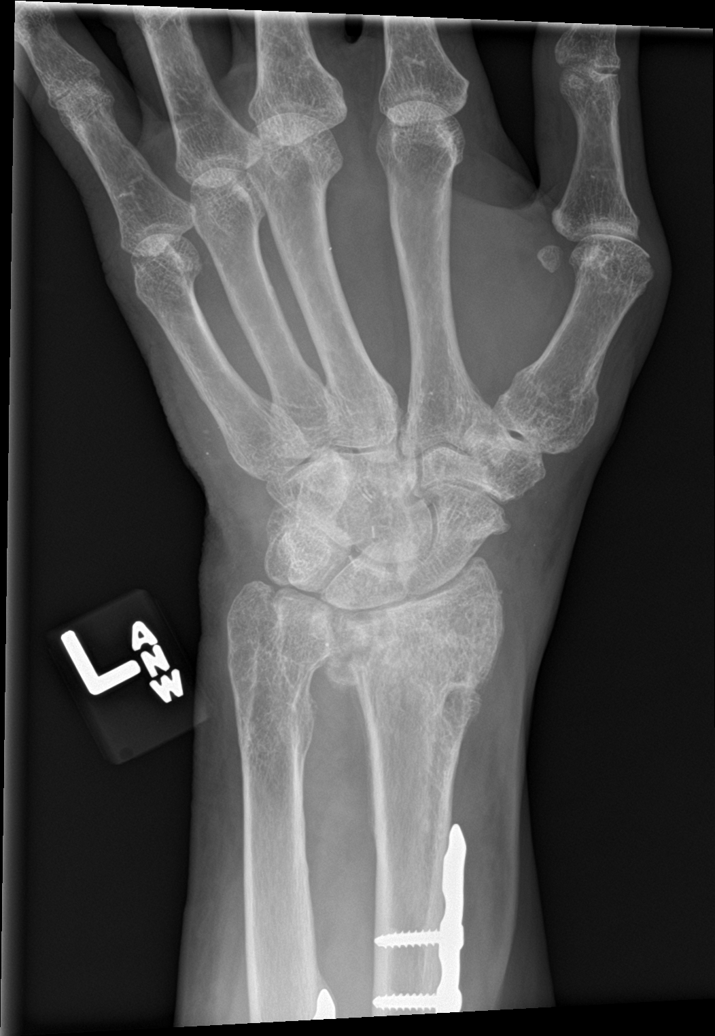

[5 of 5 positions shown; findings below may reference images not displayed]

FINDINGS: Chronic appearing partially healed fracture in the distal left
radius. Fracture line remains partially evident. Old healed distal
ulnar fracture and fracture the base of the 1st metacarpal. No
subluxation or dislocation.
IMPRESSION: Multiple old fractures. Fracture in the distal radius appears
chronic, but fracture lines remain evident.

## 2023-06-16 ENCOUNTER — Telehealth: Payer: Self-pay | Admitting: Nurse Practitioner

## 2023-06-16 NOTE — Telephone Encounter (Signed)
Called and spoke with patient and reschedule patient and transportation /Leslie.Marland KitchenMarland Kitchen

## 2023-06-20 ENCOUNTER — Emergency Department (HOSPITAL_COMMUNITY)
Admission: EM | Admit: 2023-06-20 | Discharge: 2023-06-21 | Discharge status: Against Medical Advice | Payer: Medicaid Other | Attending: Emergency Medicine | Admitting: Emergency Medicine

## 2023-06-20 ENCOUNTER — Other Ambulatory Visit: Payer: Self-pay

## 2023-06-20 ENCOUNTER — Encounter (HOSPITAL_COMMUNITY): Payer: Self-pay | Admitting: Emergency Medicine

## 2023-06-20 ENCOUNTER — Emergency Department (HOSPITAL_COMMUNITY): Payer: Medicaid Other

## 2023-06-20 DIAGNOSIS — Z5321 Procedure and treatment not carried out due to patient leaving prior to being seen by health care provider: Secondary | ICD-10-CM | POA: Diagnosis not present

## 2023-06-20 DIAGNOSIS — M546 Pain in thoracic spine: Secondary | ICD-10-CM | POA: Diagnosis present

## 2023-06-20 MED ORDER — OXYCODONE-ACETAMINOPHEN 5-325 MG PO TABS
1.0000 | ORAL_TABLET | Freq: Once | ORAL | Status: AC
Start: 2023-06-20 — End: 2023-06-20
  Administered 2023-06-20: 1 via ORAL
  Filled 2023-06-20: qty 1
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
## 2023-06-20 NOTE — ED Provider Triage Note (Signed)
Emergency Medicine Provider Triage Evaluation Note  Marquavius Vanvooren Biehn , a 57 y.o. male  was evaluated in triage.  Pt complains of back pain.  Review of Systems  Positive: Bilateral upper back pain Negative: SOB, chest pain, abdominal pain  Physical Exam  BP (!) 135/94 (BP Location: Left Arm)   Pulse (!) 130   Temp 99.9 F (37.7 C) (Oral)   Resp 18   Wt 98 kg   SpO2 95%   BMI 35.95 kg/m  Gen:   Awake, no distress   Resp:  Normal effort  MSK:   Moves extremities without difficulty  Other:  Tachycardic, no murmur  Medical Decision Making  Medically screening exam initiated at 10:38 PM.  Appropriate orders placed.  Hoy Jillyn Hidden Nordin was informed that the remainder of the evaluation will be completed by another provider, this initial triage assessment does not replace that evaluation, and the importance of remaining in the ED until their evaluation is complete.  Patient with history of same pain in upper back that started today after heavy exertion. No chest pain. Found to be significantly tachycardic on arrival to 130.   Pending: EKG, CXR Pain addressed.   Elpidio Anis, PA-C 06/20/23 2240

## 2023-06-20 NOTE — ED Triage Notes (Signed)
[place] pain 10/10. Hx of back pain does not have any medication to take for back pain. Denies any recent injury   122/86 HR 130 18 96% RA  98.9

## 2023-06-21 NOTE — ED Notes (Signed)
Found patients labels laying in chair, patient did not respond to name when called for updating vitals. Pulled OTF.

## 2023-06-23 ENCOUNTER — Other Ambulatory Visit (HOSPITAL_COMMUNITY)
Admission: EM | Admit: 2023-06-23 | Discharge: 2023-06-28 | Disposition: A | Discharge status: Other Acute Inpatient Hospital | Payer: Medicaid Other | Source: Home / Self Care | Admitting: Psychiatry

## 2023-06-23 ENCOUNTER — Ambulatory Visit (HOSPITAL_COMMUNITY)
Admission: EM | Admit: 2023-06-23 | Discharge: 2023-06-23 | Disposition: A | Discharge status: Other Acute Inpatient Hospital | Payer: Medicaid Other | Attending: Licensed Clinical Social Worker | Admitting: Licensed Clinical Social Worker

## 2023-06-23 DIAGNOSIS — F1910 Other psychoactive substance abuse, uncomplicated: Secondary | ICD-10-CM | POA: Diagnosis present

## 2023-06-23 DIAGNOSIS — G8929 Other chronic pain: Secondary | ICD-10-CM | POA: Insufficient documentation

## 2023-06-23 DIAGNOSIS — F1210 Cannabis abuse, uncomplicated: Secondary | ICD-10-CM | POA: Insufficient documentation

## 2023-06-23 DIAGNOSIS — F1410 Cocaine abuse, uncomplicated: Secondary | ICD-10-CM | POA: Insufficient documentation

## 2023-06-23 DIAGNOSIS — F329 Major depressive disorder, single episode, unspecified: Secondary | ICD-10-CM | POA: Insufficient documentation

## 2023-06-23 DIAGNOSIS — F1510 Other stimulant abuse, uncomplicated: Secondary | ICD-10-CM | POA: Insufficient documentation

## 2023-06-23 DIAGNOSIS — F1110 Opioid abuse, uncomplicated: Secondary | ICD-10-CM | POA: Insufficient documentation

## 2023-06-23 DIAGNOSIS — F1590 Other stimulant use, unspecified, uncomplicated: Secondary | ICD-10-CM

## 2023-06-23 DIAGNOSIS — Z5901 Sheltered homelessness: Secondary | ICD-10-CM | POA: Insufficient documentation

## 2023-06-23 DIAGNOSIS — F1010 Alcohol abuse, uncomplicated: Secondary | ICD-10-CM | POA: Diagnosis not present

## 2023-06-23 DIAGNOSIS — F3130 Bipolar disorder, current episode depressed, mild or moderate severity, unspecified: Secondary | ICD-10-CM

## 2023-06-23 DIAGNOSIS — F1911 Other psychoactive substance abuse, in remission: Secondary | ICD-10-CM | POA: Insufficient documentation

## 2023-06-23 MED ORDER — LORAZEPAM 1 MG PO TABS: 1.0000 mg | ORAL_TABLET | Freq: Three times a day (TID) | ORAL | Status: DC

## 2023-06-23 MED ORDER — ACETAMINOPHEN 325 MG PO TABS
650.0000 mg | ORAL_TABLET | Freq: Four times a day (QID) | ORAL | Status: DC | PRN
  Administered 2023-06-24 – 2023-06-28 (×9): 650 mg via ORAL
  Filled 2023-06-23 (×9): qty 2

## 2023-06-23 MED ORDER — HYDROXYZINE HCL 25 MG PO TABS
25.0000 mg | ORAL_TABLET | Freq: Three times a day (TID) | ORAL | Status: DC | PRN
  Administered 2023-06-24 – 2023-06-27 (×5): 25 mg via ORAL
  Filled 2023-06-23 (×3): qty 1

## 2023-06-23 MED ORDER — MAGNESIUM HYDROXIDE 400 MG/5ML PO SUSP: 30.0000 mL | Freq: Every day | ORAL | Status: DC | PRN

## 2023-06-23 MED ORDER — LORAZEPAM 1 MG PO TABS
1.0000 mg | ORAL_TABLET | Freq: Four times a day (QID) | ORAL | Status: DC
  Administered 2023-06-24 (×3): 1 mg via ORAL
  Filled 2023-06-23 (×3): qty 1

## 2023-06-23 MED ORDER — HYDROXYZINE HCL 25 MG PO TABS
25.0000 mg | ORAL_TABLET | Freq: Four times a day (QID) | ORAL | Status: AC | PRN
  Filled 2023-06-23 (×2): qty 1

## 2023-06-23 MED ORDER — THIAMINE MONONITRATE 100 MG PO TABS
100.0000 mg | ORAL_TABLET | Freq: Every day | ORAL | Status: DC
Start: 2023-06-24 — End: 2023-06-28
  Administered 2023-06-24 – 2023-06-28 (×5): 100 mg via ORAL
  Filled 2023-06-23 (×5): qty 1

## 2023-06-23 MED ORDER — ALUM & MAG HYDROXIDE-SIMETH 200-200-20 MG/5ML PO SUSP: 30.0000 mL | ORAL | Status: DC | PRN

## 2023-06-23 MED ORDER — BENZTROPINE MESYLATE 1 MG PO TABS: 1.0000 mg | ORAL_TABLET | Freq: Four times a day (QID) | ORAL | Status: DC | PRN

## 2023-06-23 MED ORDER — HALOPERIDOL 5 MG PO TABS: 5.0000 mg | ORAL_TABLET | Freq: Four times a day (QID) | ORAL | Status: DC | PRN

## 2023-06-23 MED ORDER — THIAMINE HCL 100 MG/ML IJ SOLN
100.0000 mg | Freq: Once | INTRAMUSCULAR | Status: AC
  Administered 2023-06-24: 100 mg via INTRAMUSCULAR
  Filled 2023-06-23: qty 2

## 2023-06-23 MED ORDER — LORAZEPAM 1 MG PO TABS: 1.0000 mg | ORAL_TABLET | Freq: Two times a day (BID) | ORAL | Status: DC

## 2023-06-23 MED ORDER — LORAZEPAM 1 MG PO TABS: 1.0000 mg | ORAL_TABLET | Freq: Every day | ORAL | Status: DC

## 2023-06-23 MED ORDER — TRAZODONE HCL 50 MG PO TABS
50.0000 mg | ORAL_TABLET | Freq: Every evening | ORAL | Status: DC | PRN
  Administered 2023-06-24 – 2023-06-27 (×3): 50 mg via ORAL
  Filled 2023-06-23 (×3): qty 1

## 2023-06-23 MED ORDER — LORAZEPAM 1 MG PO TABS
1.0000 mg | ORAL_TABLET | Freq: Four times a day (QID) | ORAL | Status: AC | PRN
  Administered 2023-06-26: 1 mg via ORAL
  Filled 2023-06-23: qty 1

## 2023-06-23 MED ORDER — ADULT MULTIVITAMIN W/MINERALS CH
1.0000 | ORAL_TABLET | Freq: Every day | ORAL | Status: DC
  Administered 2023-06-24 – 2023-06-28 (×5): 1 via ORAL
  Filled 2023-06-23 (×5): qty 1

## 2023-06-23 MED ORDER — ONDANSETRON 4 MG PO TBDP: 4.0000 mg | ORAL_TABLET | Freq: Four times a day (QID) | ORAL | Status: AC | PRN

## 2023-06-23 MED ORDER — LOPERAMIDE HCL 2 MG PO CAPS: 2.0000 mg | ORAL_CAPSULE | ORAL | Status: AC | PRN
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
## 2023-06-24 ENCOUNTER — Encounter (HOSPITAL_COMMUNITY): Payer: Self-pay | Admitting: Psychiatry

## 2023-06-24 DIAGNOSIS — F1210 Cannabis abuse, uncomplicated: Secondary | ICD-10-CM | POA: Diagnosis not present

## 2023-06-24 DIAGNOSIS — F1010 Alcohol abuse, uncomplicated: Secondary | ICD-10-CM | POA: Diagnosis not present

## 2023-06-24 DIAGNOSIS — F1510 Other stimulant abuse, uncomplicated: Secondary | ICD-10-CM | POA: Diagnosis not present

## 2023-06-24 DIAGNOSIS — F1110 Opioid abuse, uncomplicated: Secondary | ICD-10-CM | POA: Diagnosis not present

## 2023-06-24 LAB — CBC WITH DIFFERENTIAL/PLATELET
Abs Immature Granulocytes: 0.3 10*3/uL — ABNORMAL HIGH (ref 0.00–0.07)
Basophils Absolute: 0.5 10*3/uL — ABNORMAL HIGH (ref 0.0–0.1)
Basophils Relative: 1 %
Eosinophils Absolute: 0.3 10*3/uL (ref 0.0–0.5)
Eosinophils Relative: 0 %
HCT: 48.7 % (ref 39.0–52.0)
Hemoglobin: 14.8 g/dL (ref 13.0–17.0)
Immature Granulocytes: 0 %
Lymphocytes Relative: 75 %
Lymphs Abs: 67.6 10*3/uL — ABNORMAL HIGH (ref 0.7–4.0)
MCH: 28.1 pg (ref 26.0–34.0)
MCHC: 30.4 g/dL (ref 30.0–36.0)
MCV: 92.6 fL (ref 80.0–100.0)
Monocytes Absolute: 14.9 10*3/uL — ABNORMAL HIGH (ref 0.1–1.0)
Monocytes Relative: 17 %
Neutro Abs: 6.7 10*3/uL (ref 1.7–7.7)
Neutrophils Relative %: 7 %
Platelets: 147 10*3/uL — ABNORMAL LOW (ref 150–400)
RBC: 5.26 MIL/uL (ref 4.22–5.81)
RDW: 15 % (ref 11.5–15.5)
WBC: 90.2 10*3/uL (ref 4.0–10.5)
nRBC: 0 % (ref 0.0–0.2)

## 2023-06-24 LAB — COMPREHENSIVE METABOLIC PANEL
ALT: 52 U/L — ABNORMAL HIGH (ref 0–44)
AST: 79 U/L — ABNORMAL HIGH (ref 15–41)
Albumin: 3.6 g/dL (ref 3.5–5.0)
Alkaline Phosphatase: 22 U/L — ABNORMAL LOW (ref 38–126)
Anion gap: 12 (ref 5–15)
BUN: 20 mg/dL (ref 6–20)
CO2: 25 mmol/L (ref 22–32)
Calcium: 8.9 mg/dL (ref 8.9–10.3)
Chloride: 102 mmol/L (ref 98–111)
Creatinine, Ser: 1.32 mg/dL — ABNORMAL HIGH (ref 0.61–1.24)
GFR, Estimated: >60 mL/min (ref >60)
Glucose, Bld: 83 mg/dL (ref 70–99)
Potassium: 5 mmol/L (ref 3.5–5.1)
Sodium: 139 mmol/L (ref 135–145)
Total Bilirubin: 1 mg/dL (ref 0.0–1.2)
Total Protein: 5.8 g/dL — ABNORMAL LOW (ref 6.5–8.1)

## 2023-06-24 LAB — POCT URINE DRUG SCREEN - MANUAL ENTRY (I-SCREEN)
POC Amphetamine UR: POSITIVE — AB
POC Buprenorphine (BUP): NOT DETECTED
POC Cocaine UR: POSITIVE — AB
POC Marijuana UR: POSITIVE — AB
POC Methadone UR: NOT DETECTED
POC Methamphetamine UR: POSITIVE — AB
POC Morphine: NOT DETECTED
POC Oxazepam (BZO): NOT DETECTED
POC Oxycodone UR: NOT DETECTED
POC Secobarbital (BAR): NOT DETECTED

## 2023-06-24 LAB — HEMOGLOBIN A1C
Hgb A1c MFr Bld: 6 % — ABNORMAL HIGH (ref 4.8–5.6)
Mean Plasma Glucose: 126 mg/dL

## 2023-06-24 LAB — PATHOLOGIST SMEAR REVIEW

## 2023-06-24 MED ORDER — QUETIAPINE FUMARATE 50 MG PO TABS
150.0000 mg | ORAL_TABLET | Freq: Every day | ORAL | Status: DC
  Administered 2023-06-24 – 2023-06-25 (×2): 150 mg via ORAL
  Filled 2023-06-24 (×2): qty 1

## 2023-06-24 NOTE — ED Notes (Signed)
 Pt was provided lunch

## 2023-06-24 NOTE — Group Note (Signed)
 Group Topic: Understanding Self  Group Date: 06/24/2023 Start Time: 1225 End Time: 1250 Facilitators: Herold Lajuana NOVAK, RN  Department: [facility]  Number of Participants: 2  Group Focus: affirmation, coping skills, and nursing group Treatment Modality:  Psychoeducation Interventions utilized were problem solving Purpose: enhance coping skills, increase insight, and regain self-worth  Name: Alvin Finley Date of Birth: [birth year]  MR: [id number]    Level of Participation: active Quality of Participation: attentive and cooperative Interactions with others: gave feedback Mood/Affect: appropriate Triggers (if applicable): none identified Cognition: coherent/clear Progress: Gaining insight Response: I know my life can get better if I leave these drugs alone Plan: patient will be encouraged to continue to stay focused on recovery and seek help when needed  Patients Problems:  Patient Active Problem List   Diagnosis Date Noted   Polysubstance abuse (HCC) 06/23/2023   Screening for colorectal cancer 11/09/2022   Polysubstance use disorder 10/29/2022   Alcohol use 10/29/2022   CLL (chronic lymphocytic leukemia) (HCC) 10/29/2022   Chronic pain 10/29/2022   Encounter to establish care 10/29/2022   Hypertension 10/28/2022

## 2023-06-24 NOTE — Group Note (Signed)
 Group Topic: Recovery Basics  Group Date: 06/24/2023 Start Time: 1000 End Time: 1040 Facilitators: Judi Monico RAMAN, NT  Department: [facility]  Number of Participants: 3  Group Focus: affirmation, check in, coping skills, and daily focus Treatment Modality:  Psychoeducation Interventions utilized were exploration, patient education, and support Purpose: enhance coping skills, express feelings, and increase insight  Name: Alvin Finley Date of Birth: 11-15-1965  MR: [id number]    Level of Participation: minimal Quality of Participation: withdrawn Interactions with others: gave feedback Mood/Affect: closed / guarded and flat Triggers (if applicable): n/a Cognition: coherent/clear Progress: Minimal Response: Pt attended some of group but had to leave to speak with his treatment team. Plan: patient will be encouraged to attend future groups.  Patients Problems:  Patient Active Problem List   Diagnosis Date Noted   Polysubstance abuse (HCC) 06/23/2023   Screening for colorectal cancer 11/09/2022   Polysubstance use disorder 10/29/2022   Alcohol use 10/29/2022   CLL (chronic lymphocytic leukemia) (HCC) 10/29/2022   Chronic pain 10/29/2022   Encounter to establish care 10/29/2022   Hypertension 10/28/2022
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
## 2023-06-24 NOTE — ED Notes (Signed)
 Pt is currently sleeping, no distress noted, environmental check complete, will continue to monitor patient for safety.

## 2023-06-24 NOTE — Group Note (Signed)
 Group Topic: Spirituality in Recovery  Group Date: 06/24/2023 Start Time: 0230 End Time: 0330 Facilitators: Vanice Merlynn RAMAN, LCSW  Department: [facility]  Number of Participants: 2  Group Focus: acceptance, affirmation, clarity of thought, feeling awareness/expression, forgiveness, personal responsibility, relapse prevention, self-awareness, and self-esteem Treatment Modality:  Cognitive Behavioral Therapy and Spiritual Interventions utilized were story telling and support Purpose: explore maladaptive thinking, express feelings, increase insight, and regain self-worth  Name: Alvin Finley Date of Birth: 1966/01/15  MR: [id number]    Level of Participation: active Quality of Participation: attentive and cooperative Interactions with others: gave feedback Mood/Affect: appropriate Triggers (if applicable): Family Cognition: coherent/clear Progress: Gaining insight Summary of Patient Progress: Patient actively participated in group on today. Patient reports that the video clip definitely made him reflect on his life and the things he should change. Patient reports he has been wanting to figure out his purpose in life as he knows there is more he needs to be doing. Patient reflecting on his past mistakes and the things he wants to do to get his life back on track. Patient reports he knows he needs to change his environment. Patient reports feeling encouraged from watching the video, and his was able to provide feedback to staff and peers.   Plan: referral / recommendations  Patients Problems:  Patient Active Problem List   Diagnosis Date Noted   Polysubstance abuse (HCC) 06/23/2023   Screening for colorectal cancer 11/09/2022   Polysubstance use disorder 10/29/2022   Alcohol use 10/29/2022   CLL (chronic lymphocytic leukemia) (HCC) 10/29/2022   Chronic pain 10/29/2022   Encounter to establish care 10/29/2022   Hypertension 10/28/2022

## 2023-06-24 NOTE — ED Notes (Signed)
 Pt sleeping in no acute distress. RR even and unlabored. Environment secured. Will continue to monitor for safety.

## 2023-06-24 NOTE — ED Notes (Signed)
 SPIRITUALITY GROUP NOTE  Spirituality group facilitated by Chaplain Mathayus Stanbery, MDiv, B[facility].  Group Description: Group focused on topic of hope. Patients participated in facilitated discussion around topic, connecting with one another around experiences and definitions for hope. Group members engaged with visual explorer exercise, reflecting on what hope looks like for them today. Group engaged in discussion around how their definitions of hope are present today in hospital.  Modalities: Psycho-social ed, Adlerian, Narrative, MI  Patient Progress: Alvin Finley was present throughout group.  Engaged in group discussion.  Noted that he thrives with schedule and routine and described how this helps him stay focused and have boundaries.

## 2023-06-24 NOTE — Discharge Planning (Signed)
 Patient has been denied by [facility] and [facility] due to insurance. LCSW will continue to search for placement for the patient.   Fernande Boyden, LCSW Clinical Social Worker [facility] Ph: [phone]

## 2023-06-24 NOTE — ED Notes (Signed)
 Pt sitting in dayroom actively participating in group. No acute distress noted. No concerns voiced. Informed pt to notify staff with any needs or assistance. Pt verbalized understanding or agreement. Will continue to monitor for safety.

## 2023-06-24 NOTE — ED Notes (Addendum)
 Patient A&Ox4. Denies intent to harm self/others when asked. Denies A/VH. Patient denies any physical complaints when asked. No acute distress noted. Pt states, I feel much better today. Support and encouragement provided. MD made aware of pt's critical lab value WBC 90.2, per previous shift report. MD stated he would f/u on report. Routine safety checks conducted according to facility protocol. Encouraged patient to notify staff if thoughts of harm toward self or others arise. Patient verbalize understanding and agreement. Will continue to monitor for safety.

## 2023-06-24 NOTE — ED Notes (Signed)
 Patient is sleeping. Respirations equal and unlabored, skin warm and dry. No change in assessment or acuity. Routine safety checks conducted according to facility protocol. Will continue to monitor for safety.

## 2023-06-24 NOTE — Discharge Instructions (Addendum)
 [facility] [employer]: [address], [place], [place] [address] Phone: [phone]  [facility] [address], [place], [address] [phone] phone  New Patient Assessment/Therapy Walk-Ins:  Monday and Wednesday: 8 am until slots are full. Every 1st and 2nd Fridays of the month: 1 pm - 5 pm.  NO ASSESSMENT/THERAPY WALK-INS ON TUESDAYS OR THURSDAYS  New Patient Assessment/Medication Management Walk-Ins:  Monday - Friday:  8 am - 11 am.  For all walk-ins, we ask that you arrive by 7:30 am because patients will be seen in the order of arrival.  Availability is limited; therefore, you may not be seen on the same day that you walk-in.  Our goal is to serve and meet the needs of our community to the best of our ability.  SUBSTANCE USE TREATMENT for Medicaid and State Funded/IPRS  [facility] [address], [place], [address] [phone] phone NOTE: [facility] is no longer offering IOP services.  Serves those who are low-income or have no insurance.  [facility] [address] [place], [place], [address] [phone] phone [phone] fax NOTE: Does have Substance Abuse-Intensive Outpatient Program [facility]) as well as transitional housing if eligible.  [facility] [address] [place], [place], [address] [phone] phone [phone] fax  [facility] [phone] [place], [place], [address] [phone] phone [phone] fax  HALFWAY HOUSES:  [facility] [phone]  [employer].oxfordvacancies.com  12 STEP PROGRAMS:  Alcoholics Anonymous of [place] softwarechalet.be  Narcotics Anonymous of [place] hitprotect.dk  Al-Anon of [place], [place] www.[place]alanon.org/find-meetings.html  Nar-Anon https://nar-anon.org/find-a-meetin  List of Residential placements:   [facility] in [place]: [phone]  [facility]: [phone]  [place], [place] [phone]: Male and male facility; 30-day program: (uninsured and Medicaid such as [place], [place], [place], partners)  [facility]: [phone]; men and women's facility; 28 days; Can have Medicaid tailored plan Tour Manager or Partners)  [facility]: [phone] [facility] or [place]; 28 day program; must be fully detox; tailored Medicaid or no insurance  [address] in [place], [place]; [phone]; 28 day all males program; no insurance accepted  [facility] Referral in [place]: [place] [phone] (no insurance or Medicaid only); 90 days; outpatient services but provide housing in apartments [place]  [facility] Admission: [phone]: Patient must complete phone screening for placement: [place], [place]; 6 month program; uninsured, Medicaid, and [employer].   [facility]: no insurance required; [phone]  [facility]: [phone]; Intake: [place]; Must fill out application online; Steffan Delay [phone] x [address] [facility] in [place], [place]: [phone]; Admissions Coordinators Mr. Marinda or Alm Eagles; 90 day program.  [facility]: [place], [place] [phone]; Co-Ed 9 month to a year program; Online application; Men entry fee is $500 (6-10months);  [employer]: [address] [place], [place] [address]; no fee or insurance required; minimum of 2 years; Highly structured; work based; Intake Coordinator is [place] [phone]  [facility] in [place], [place]: [phone]; Fax number is [phone]; website: www.[facility].org; Requires 3-6 page autobiography; 2 year program (18 months and then 24month transitional housing); Admission fee is $300; no insurance needed; work Automotive Engineer in [place], [place]: [employer] Desk Staff: [place] [phone]: They have a Men's  Regenerations Program 6-25months. Free program; There is an initial $300 fee however, they are willing to work with patients regarding that. Application is online.  [facility]  [place]: Admissions [phone] [place] [phone] ext [phone]; Any 7-90 day program is out of pocket; 12 month program is free of charge; there is a $275 entry fee; Patient is responsible for own transportation
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
## 2023-06-24 NOTE — ED Notes (Signed)
 Writer received a call from [facility], a lab tech, about pt having critical WBC Result of 90.2. NP notified.
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
## 2023-06-25 DIAGNOSIS — F1110 Opioid abuse, uncomplicated: Secondary | ICD-10-CM | POA: Diagnosis not present

## 2023-06-25 DIAGNOSIS — F1210 Cannabis abuse, uncomplicated: Secondary | ICD-10-CM | POA: Diagnosis not present

## 2023-06-25 DIAGNOSIS — F1010 Alcohol abuse, uncomplicated: Secondary | ICD-10-CM | POA: Diagnosis not present

## 2023-06-25 DIAGNOSIS — F1510 Other stimulant abuse, uncomplicated: Secondary | ICD-10-CM | POA: Diagnosis not present

## 2023-06-25 LAB — LIPID PANEL
Cholesterol: 117 mg/dL (ref 0–200)
HDL: 28 mg/dL — ABNORMAL LOW (ref >40)
LDL Cholesterol: 46 mg/dL (ref 0–99)
Total CHOL/HDL Ratio: 4.2 {ratio}
Triglycerides: 213 mg/dL — ABNORMAL HIGH (ref <150)
VLDL: 43 mg/dL — ABNORMAL HIGH (ref 0–40)

## 2023-06-25 LAB — TSH: TSH: 1.503 u[IU]/mL (ref 0.350–4.500)

## 2023-06-25 MED ORDER — CLONIDINE HCL 0.1 MG PO TABS
0.1000 mg | ORAL_TABLET | Freq: Once | ORAL | Status: AC
  Administered 2023-06-25: 0.1 mg via ORAL
  Filled 2023-06-25: qty 1

## 2023-06-25 NOTE — ED Notes (Signed)
 Patient resting with eyes closed in no apparent acute distress. Respirations even and unlabored. Environment secured. Safety checks in place according to facility policy.

## 2023-06-25 NOTE — ED Notes (Signed)
 Abnormal BP of 134/90 obtained. Patient asymptomatic, in no acute distress. Provider Lamar Sprinkles, MD made aware.

## 2023-06-25 NOTE — ED Notes (Signed)
Patient eating breakfast. °
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
## 2023-06-25 NOTE — Group Note (Signed)
 Group Topic: Recovery Basics  Group Date: 06/25/2023 Start Time: 2000 End Time: 2100 Facilitators: Mariel Chatters  Department: [facility]  Number of Participants: 3  Group Focus: abuse issues, acceptance, coping skills, relapse prevention, and self-awareness Treatment Modality:  Leisure Development Interventions utilized were leisure development, story telling, and support Purpose: enhance coping skills, express feelings, and relapse prevention strategies  Name: Alvin Finley Date of Birth: Jun 11, 1966  MR: [id number]    Level of Participation: active Quality of Participation: attentive, cooperative, engaged, and supportive Interactions with others: gave feedback Mood/Affect: appropriate and positive Triggers (if applicable): n/a Cognition: coherent/clear Progress: Gaining insight Response: pt listened to others' recovery journey and contributed feedback and offered support.  Plan: patient will be encouraged to continue attending groups.   Patients Problems:  Patient Active Problem List   Diagnosis Date Noted   Polysubstance abuse (HCC) 06/23/2023   Screening for colorectal cancer 11/09/2022   Polysubstance use disorder 10/29/2022   Alcohol use 10/29/2022   CLL (chronic lymphocytic leukemia) (HCC) 10/29/2022   Chronic pain 10/29/2022   Encounter to establish care 10/29/2022   Hypertension 10/28/2022

## 2023-06-25 NOTE — ED Notes (Signed)
 PRN Tylenol given for pt reported pain 8/10 in arm. Medication administered with no complications. Environment secured, safety checks in place per facility policy.

## 2023-06-25 NOTE — ED Notes (Signed)
 Pt is currently sleeping, no distress noted, environmental check complete, will continue to monitor patient for safety.

## 2023-06-25 NOTE — Group Note (Signed)
 Group Topic: Overcoming Obstacles  Group Date: 06/25/2023 Start Time: 1235 End Time: 1300 Facilitators: Winfred Byers, LPN  Department: [facility]  Number of Participants: 4  Group Focus: Overcoming Obstacles Treatment Modality:  Patient-Centered Therapy Interventions utilized were story telling and support Purpose: Perseverance in the face of adversity  Name: Telvin Reinders Dung Date of Birth: Jan 31, 1966  MR: [id number]    Level of Participation: moderate Quality of Participation: attentive and cooperative Interactions with others: gave feedback Mood/Affect: appropriate and positive Triggers (if applicable): N/A Cognition: insightful and logical Progress: Gaining insight Response: N/A Plan: The patient will be encouraged to persevere through difficult times and challenging situations.  Patients Problems:  Patient Active Problem List   Diagnosis Date Noted   Polysubstance abuse (HCC) 06/23/2023   Screening for colorectal cancer 11/09/2022   Polysubstance use disorder 10/29/2022   Alcohol use 10/29/2022   CLL (chronic lymphocytic leukemia) (HCC) 10/29/2022   Chronic pain 10/29/2022   Encounter to establish care 10/29/2022   Hypertension 10/28/2022

## 2023-06-25 NOTE — ED Notes (Signed)
 Patient eating dinner.

## 2023-06-25 NOTE — ED Notes (Signed)
 Patient alert & oriented x4. Denies intent to harm self or others when asked. Denies A/VH. Patient reported pain 8/10 in arm upon scheduled med pass, PRN Tylenol  administered with no complications. No acute distress noted. Support and encouragement provided. Routine safety checks conducted per facility protocol. Encouraged patient to notify staff if any thoughts of harm towards self or others arise. Patient verbalizes understanding and agreement.

## 2023-06-25 NOTE — ED Notes (Signed)
 Pt is in the dayroom watching TV with peers. Pt denies SI/HI/AVH. Pt has no further complain.No acute distress noted. Will continue to monitor for safety and provide support.

## 2023-06-25 NOTE — ED Notes (Signed)
 One time dose of clonidine ordered and administered with no complications due to elevated BP of 134/90. Environment secured, safety checks in place per facility policy.

## 2023-06-25 NOTE — ED Notes (Addendum)
 Patient eating lunch.

## 2023-06-26 DIAGNOSIS — F1110 Opioid abuse, uncomplicated: Secondary | ICD-10-CM | POA: Diagnosis not present

## 2023-06-26 DIAGNOSIS — F1010 Alcohol abuse, uncomplicated: Secondary | ICD-10-CM | POA: Diagnosis not present

## 2023-06-26 DIAGNOSIS — F1510 Other stimulant abuse, uncomplicated: Secondary | ICD-10-CM | POA: Diagnosis not present

## 2023-06-26 DIAGNOSIS — F1210 Cannabis abuse, uncomplicated: Secondary | ICD-10-CM | POA: Diagnosis not present

## 2023-06-26 MED ORDER — QUETIAPINE FUMARATE ER 200 MG PO TB24
200.0000 mg | ORAL_TABLET | Freq: Every day | ORAL | Status: AC
  Administered 2023-06-26: 200 mg via ORAL
  Filled 2023-06-26: qty 1

## 2023-06-26 MED ORDER — AMLODIPINE BESYLATE 5 MG PO TABS
5.0000 mg | ORAL_TABLET | Freq: Every day | ORAL | Status: DC
  Administered 2023-06-26 – 2023-06-28 (×3): 5 mg via ORAL
  Filled 2023-06-26 (×3): qty 1

## 2023-06-26 MED ORDER — QUETIAPINE FUMARATE ER 300 MG PO TB24
300.0000 mg | ORAL_TABLET | Freq: Every day | ORAL | Status: DC
Start: 2023-06-27 — End: 2023-06-28
  Administered 2023-06-27: 300 mg via ORAL
  Filled 2023-06-26: qty 1

## 2023-06-26 NOTE — ED Notes (Signed)
Pt fed dinner

## 2023-06-26 NOTE — ED Notes (Signed)
 Patient alert & oriented x4. Denies intent to harm self or others when asked. Denies A/VH. Patient reports pain 9/10 in neck and back, PRN Tylenol  administered along with scheduled medications with no complications. No acute distress noted. Support and encouragement provided. Routine safety checks conducted per facility protocol. Encouraged patient to notify staff if any thoughts of harm towards self or others arise. Patient verbalizes understanding and agreement.

## 2023-06-26 NOTE — ED Notes (Signed)
 Pt sleeping at present, no distress noted.  Monitoring for safety.

## 2023-06-26 NOTE — ED Notes (Signed)
 Patient is sleeping. Respirations equal and unlabored, skin warm and dry. No change in assessment or acuity. Routine safety checks conducted according to facility protocol. Will continue to monitor for safety.

## 2023-06-26 NOTE — ED Notes (Signed)
 Patient resting with eyes closed in no apparent acute distress. Respirations even and unlabored. Environment secured. Safety checks in place according to facility policy.

## 2023-06-26 NOTE — ED Notes (Signed)
 Pt A&O x 4, in dayroom watching TV at present, no distress noted.  Calm & cooperative. Monitoring for safety.

## 2023-06-26 NOTE — ED Provider Notes (Signed)
 Behavioral Health Progress Note  Date and Time: 06/26/2023 12:38 PM Name: Halden Phegley MRN:  [id number]  Subjective:   Alvin Finley is a 58 yo male with a reported past psychiatric history of depression, anxiety, and polysubstance abuse (cocaine, methamphetamines, alcohol, cannabis). PMHx is significant for CLL, arthritis, leukemia, prediabetes, and sickle cell trait, and significant neck and back pain since a forklift accident in 2017.  He presents to [facility] voluntarily requesting detox and transition to residential rehabilitation.   Patient reports sustained improvement in mood.  He again slept well and is tolerating the Seroquel  without adverse effects.  He does note nightmares that have not yet been improved with the Seroquel  at current dosage.  He is advised that Seroquel  will be titrated to maintenance dose over the next couple of days which should provide anxiolytic coverage, and he voices understanding and agreement.  We discussed if the Seroquel  is ineffective for nightmares, primary team would discuss prazosin with him.  He reports improvements to his appetite today. He denies other somatic complaints today.  Patient denies SI, HI, and AVH today.  Per RN, patient with elevated BP this morning on both electronic and manual checks, but he was asymptomatic.  Substance use history is detailed below, per Dr. Homer: Longest period of sobriety was during certain periods of sobriety while in prison(2011-2018) Alcohol: started at age 58, drinks all day, will use up to 1 gallon daily. Last drink was day prior to arriving to [facility] Denies seizures, denies Dts or hospitlizations. Tobacco: Denies Cannabis:has been using since age 56, uses daily, smokes 2-3 joints, purchased form dealer. Denies delta8/delta9 Cocaine: using since age 69, he reports on/off use he uses in moments of weakness. Last use at hotel prior to arriving here, reports using about $200 dollars worth  Poppers: first  started using at 58 yo, reports using infrequently, reports he has enjoyed the hallucinogenic effects Methamphetamines: Using for the past couple of years (about 4 years), reports sporadic use. Spends $100-200 monthly. Psilocybin (mushrooms): Denies Ecstasy (MDMA / molly): no recent use LSD (acid): never tried: Denies Opiates (fentanyl / heroin): Reports he used once heroin when he was living in [place] 1 year ago, reports he overdosed and required narcan Benzos (Xanax, Klonopin): Denies IV Drug Use Hx: Denies Rx drug abuse: Denies Rehab hx: He has previously been to rehab programs in [place], most recently he was at [facility] for detox 3 months ago. Pt reports liefitme total times he has been 5-6 programs. He has been to [facility] previously around 2 years ago.    Past Psychiatric Hx: Current Psychiatrist: Current Therapist: Previous Psychiatric Diagnoses: per pt, depression, anxiety Current psychiatric medications:unsure Psychiatric medication history/compliance: Psychiatric Hospitalization hx: Psychotherapy hx: Neuromodulation history:  History of suicide (obtained from HPI): History of homicide or aggression (obtained in HPI):   Past Medical History: PCP: Medical Dx: CLL, crushing injury of left arm requiring skin grafts, chronic back pain, prediabetes Medications: Denies Allergies: Denies Hospitalizations: Surgeries: Trauma: Patient sustained a left arm fracture from a forklift accident in 2017 Seizures: Denies   Family Medical History: Mostly unknown to patient, believes HTN may run in his family  Family Psychiatric History: Psychiatric Dx: Denies Suicide Hx: Denies Violence/Aggression: Denies Substance use: reports he has 3 brothers that used to have addiction, sister --crack cocaine use  Social History: Living Situation: Currently staying at a hotel the past 3-4 days Social Support: Reports he has  sister in [place]  he can count on, reports they talk daily  Education:  completed some college courses Occupational hx: Receiving SSI for forklift accident. 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 Marital Status: Single Children: Denies Legal: Denies legal charges or upcoming court dates. Previously incarcerated. Military: Denies  Access to firearms: Denies    Diagnosis:  Final diagnoses:  Polysubstance abuse (HCC)  Bipolar I disorder, most recent episode depressed (HCC)  Stimulant use disorder    Total Time spent with patient: 30 minutes   Current Medications:  Current Facility-Administered Medications  Medication Dose Route Frequency Provider Last Rate Last Admin   acetaminophen  (TYLENOL ) tablet 650 mg  650 mg Oral Q6H PRN McLauchlin, Angela, NP   650 mg at 06/26/23 0937   alum & mag hydroxide-simeth (MAALOX/MYLANTA) 200-200-20 MG/5ML suspension 30 mL  30 mL Oral Q4H PRN McLauchlin, Angela, NP       amLODipine  (NORVASC ) tablet 5 mg  5 mg Oral Daily Rainelle Pfeiffer, MD   5 mg at 06/26/23 1220   haloperidol  (HALDOL ) tablet 5 mg  5 mg Oral Q6H PRN McLauchlin, Angela, NP       And   benztropine  (COGENTIN ) tablet 1 mg  1 mg Oral Q6H PRN McLauchlin, Angela, NP       hydrOXYzine  (ATARAX ) tablet 25 mg  25 mg Oral Q6H PRN McLauchlin, Angela, NP       hydrOXYzine  (ATARAX ) tablet 25 mg  25 mg Oral TID PRN McLauchlin, Angela, NP   25 mg at 06/25/23 2113   loperamide  (IMODIUM ) capsule 2-4 mg  2-4 mg Oral PRN McLauchlin, Angela, NP       LORazepam  (ATIVAN ) tablet 1 mg  1 mg Oral Q6H PRN McLauchlin, Angela, NP       magnesium  hydroxide (MILK OF MAGNESIA) suspension 30 mL  30 mL Oral Daily PRN McLauchlin, Angela, NP       multivitamin with minerals tablet 1 tablet  1 tablet Oral Daily McLauchlin, Angela, NP   1 tablet at 06/26/23 0937   ondansetron  (ZOFRAN -ODT) disintegrating tablet 4 mg  4 mg Oral Q6H PRN McLauchlin, Angela, NP       QUEtiapine  (SEROQUEL  XR) 24 hr tablet 200 mg  200 mg Oral QHS Rainelle Pfeiffer, MD       Followed by   NOREEN ON 06/27/2023] QUEtiapine  (SEROQUEL  XR) 24 hr tablet 300 mg  300 mg Oral QHS Rainelle Pfeiffer, MD       thiamine  (VITAMIN B1) tablet 100 mg  100 mg Oral Daily McLauchlin, Angela, NP   100 mg at 06/26/23 9062   traZODone  (DESYREL ) tablet 50 mg  50 mg Oral QHS PRN McLauchlin, Angela, NP   50 mg at 06/25/23 2113   Current Outpatient Medications  Medication Sig Dispense Refill   amLODipine  (NORVASC ) 5 MG tablet Take 1 tablet (5 mg total) by mouth daily. 30 tablet 11   DULoxetine  (CYMBALTA ) 30 MG capsule TAKE 1 CAPSULE BY MOUTH EVERY DAY (Patient not taking: Reported on 06/24/2023) 90 capsule 1   gabapentin (NEURONTIN) 100 MG capsule Take 200 mg by mouth 3 (three) times daily. (Patient not taking: Reported on 06/24/2023)     methocarbamol  (ROBAXIN ) 500 MG tablet Take 500 mg by mouth every 8 (eight) hours as needed for muscle spasms. (Patient not taking: Reported on 06/24/2023)      Labs  Lab Results:  Admission on 06/23/2023  Component Date Value Ref Range Status   WBC 06/23/2023 90.2 (HH)  4.0 - 10.5 K/uL Final   Comment:  REPEATED TO VERIFY THIS CRITICAL RESULT HAS VERIFIED AND BEEN CALLED TO JUDITH FORSON, RN BY JEZREEL LAS IGAN ON 01 03 2025 AT 0432, AND HAS BEEN READ BACK.     RBC 06/23/2023 5.26  4.22 - 5.81 MIL/uL Final   Hemoglobin 06/23/2023 14.8  13.0 - 17.0 g/dL Final   HCT 98/97/7974 48.7  39.0 - 52.0 % Final   MCV 06/23/2023 92.6  80.0 - 100.0 fL Final   MCH 06/23/2023 28.1  26.0 - 34.0 pg Final   MCHC 06/23/2023 30.4  30.0 - 36.0 g/dL Final   RDW 98/97/7974 15.0  11.5 - 15.5 % Final   Platelets 06/23/2023 147 (L)  150 - 400 K/uL Final   REPEATED TO VERIFY   nRBC 06/23/2023 0.0  0.0 - 0.2 % Final   Neutrophils Relative % 06/23/2023 7  % Final   Neutro Abs 06/23/2023 6.7  1.7 - 7.7 K/uL Final   Lymphocytes Relative 06/23/2023 75  % Final   Lymphs Abs 06/23/2023 67.6 (H)  0.7 - 4.0 K/uL Final   Monocytes Relative 06/23/2023 17  % Final    Monocytes Absolute 06/23/2023 14.9 (H)  0.1 - 1.0 K/uL Final   Eosinophils Relative 06/23/2023 0  % Final   Eosinophils Absolute 06/23/2023 0.3  0.0 - 0.5 K/uL Final   Basophils Relative 06/23/2023 1  % Final   Basophils Absolute 06/23/2023 0.5 (H)  0.0 - 0.1 K/uL Final   Immature Granulocytes 06/23/2023 0  % Final   Abs Immature Granulocytes 06/23/2023 0.30 (H)  0.00 - 0.07 K/uL Final   Polychromasia 06/23/2023 PRESENT   Final   Performed at [facility], [address] [place], [place] [address]   Sodium 06/23/2023 139  135 - 145 mmol/L Final   Potassium 06/23/2023 5.0  3.5 - 5.1 mmol/L Final   HEMOLYSIS AT THIS LEVEL MAY AFFECT RESULT   Chloride 06/23/2023 102  98 - 111 mmol/L Final   CO2 06/23/2023 25  22 - 32 mmol/L Final   Glucose, Bld 06/23/2023 83  70 - 99 mg/dL Final   Glucose reference range applies only to samples taken after fasting for at least 8 hours.   BUN 06/23/2023 20  6 - 20 mg/dL Final   Creatinine, Ser 06/23/2023 1.32 (H)  0.61 - 1.24 mg/dL Final   Calcium 98/97/7974 8.9  8.9 - 10.3 mg/dL Final   Total Protein 98/97/7974 5.8 (L)  6.5 - 8.1 g/dL Final   Albumin 98/97/7974 3.6  3.5 - 5.0 g/dL Final   AST 98/97/7974 79 (H)  15 - 41 U/L Final   HEMOLYSIS AT THIS LEVEL MAY AFFECT RESULT   ALT 06/23/2023 52 (H)  0 - 44 U/L Final   HEMOLYSIS AT THIS LEVEL MAY AFFECT RESULT   Alkaline Phosphatase 06/23/2023 22 (L)  38 - 126 U/L Final   Total Bilirubin 06/23/2023 1.0  0.0 - 1.2 mg/dL Final   HEMOLYSIS AT THIS LEVEL MAY AFFECT RESULT   GFR, Estimated 06/23/2023 >60  >60 mL/min Final   Comment: (NOTE) Calculated using the CKD-EPI Creatinine Equation (2021)    Anion gap 06/23/2023 12  5 - 15 Final   Performed at [facility], [address], [place], [place] [address]   Hgb A1c MFr Bld 06/23/2023 6.0 (H)  4.8 - 5.6 % Final   Comment: (NOTE)         Prediabetes: 5.7 - 6.4         Diabetes: >6.4  Glycemic control for adults with diabetes: <7.0     Mean Plasma Glucose 06/23/2023 126  mg/dL Final   Comment: (NOTE) Performed At: [facility] [address] [place], [place] [id number] Jennette Shorter MD Ey:[id number]    POC Amphetamine UR 06/24/2023 Positive (A)  NONE DETECTED (Cut Off Level 1000 ng/mL) Final   POC Secobarbital (BAR) 06/24/2023 None Detected  NONE DETECTED (Cut Off Level 300 ng/mL) Final   POC Buprenorphine (BUP) 06/24/2023 None Detected  NONE DETECTED (Cut Off Level 10 ng/mL) Final   POC Oxazepam (BZO) 06/24/2023 None Detected  NONE DETECTED (Cut Off Level 300 ng/mL) Final   POC Cocaine UR 06/24/2023 Positive (A)  NONE DETECTED (Cut Off Level 300 ng/mL) Final   POC Methamphetamine UR 06/24/2023 Positive (A)  NONE DETECTED (Cut Off Level 1000 ng/mL) Final   POC Morphine 06/24/2023 None Detected  NONE DETECTED (Cut Off Level 300 ng/mL) Final   POC Methadone UR 06/24/2023 None Detected  NONE DETECTED (Cut Off Level 300 ng/mL) Final   POC Oxycodone  UR 06/24/2023 None Detected  NONE DETECTED (Cut Off Level 100 ng/mL) Final   POC Marijuana UR 06/24/2023 Positive (A)  NONE DETECTED (Cut Off Level 50 ng/mL) Final   Path Review 06/23/2023  Marked lymphocytosis with smudge cells consistent with chronic lymphocytic leukemia.   Final   Comment: Reviewed by Norleen BIRCH. Belvie, M.D.  06/24/2023 Performed at [facility], [address] [place], [place] [address]    Cholesterol 06/25/2023 117  0 - 200 mg/dL Final   Triglycerides 98/95/7974 213 (H)  <150 mg/dL Final   HDL 98/95/7974 28 (L)  >40 mg/dL Final   Total CHOL/HDL Ratio 06/25/2023 4.2  RATIO Final   VLDL 06/25/2023 43 (H)  0 - 40 mg/dL Final   LDL Cholesterol 06/25/2023 46  0 - 99 mg/dL Final   Comment:        Total Cholesterol/HDL:CHD Risk Coronary Heart Disease Risk Table                     Men   Women  1/2 Average Risk   3.4   3.3  Average Risk       5.0   4.4  2 X Average Risk   9.6   7.1  3 X Average Risk  23.4   11.0        Use the calculated Patient  Ratio above and the CHD Risk Table to determine the patient's CHD Risk.        ATP III CLASSIFICATION (LDL):  <100     mg/dL   Optimal  899-870  mg/dL   Near or Above                    Optimal  130-159  mg/dL   Borderline  839-810  mg/dL   High  >809     mg/dL   Very High Performed at [facility], [address], [place], [place] [address]    TSH 06/25/2023 1.503  0.350 - 4.500 uIU/mL Final   Comment: Performed by a 3rd Generation assay with a functional sensitivity of <=0.01 uIU/mL. Performed at [facility], [address], [place], [place] [address]     Blood Alcohol level:  No results found for: [facility]  Metabolic Disorder Labs: Lab Results  Component Value Date   HGBA1C 6.0 (H) 06/23/2023   MPG 126 06/23/2023   No results found for: PROLACTIN Lab Results  Component Value Date   CHOL 117 06/25/2023   TRIG 213 (H) 06/25/2023   HDL 28 (L) 06/25/2023   CHOLHDL 4.2 06/25/2023   VLDL 43 (H) 06/25/2023   LDLCALC 46 06/25/2023    Therapeutic Lab Levels: No results found for: LITHIUM No results found for: VALPROATE No results found for: CBMZ  Physical Findings   PHQ2-9    Flowsheet Row ED from 06/23/2023 in [facility] Office Visit from 10/28/2022 in [facility]  PHQ-2 Total Score 1 2  PHQ-9 Total Score 21 11      Flowsheet Row ED from 06/23/2023 in [facility] Most recent reading at 06/24/2023 12:36 AM ED from 06/23/2023 in [facility] Most recent reading at 06/23/2023  6:27 PM ED from 06/20/2023 in [facility] Emergency Department at [facility] Most recent reading at 06/20/2023 10:13 PM  C-SSRS RISK CATEGORY Low Risk Low Risk No Risk        Musculoskeletal  Strength & Muscle Tone: within normal limits Gait & Station: normal Patient leans: N/A  Psychiatric Specialty Exam  Presentation   General Appearance:  Appropriate for Environment; Casual  Eye Contact: Fair  Speech: Clear and Coherent; Normal Rate  Speech Volume: Normal  Handedness: Left   Mood and Affect  Mood: Anxious; Euthymic   Affect: Appropriate; Congruent   Thought Process  Thought Processes: Coherent  Descriptions of Associations:Intact  Orientation:Full (Time, Place and Person)  Thought Content:Logical; WDL  Diagnosis of Schizophrenia or Schizoaffective disorder in past: No    Hallucinations:Hallucinations: None   Ideas of Reference:None  Suicidal Thoughts:Suicidal Thoughts: No SI Passive Intent and/or Plan: Without Intent; Without Plan   Homicidal Thoughts:Homicidal Thoughts: No    Sensorium  Memory: Immediate Good; Recent Good  Judgment: Fair  Insight: Fair   Executive Functions  Concentration: Good  Attention Span: Good  Recall: Good  Fund of Knowledge: Good  Language: Good   Psychomotor Activity  Psychomotor Activity: Psychomotor Activity: Normal    Assets  Assets: Communication Skills; Desire for Improvement   Sleep  Sleep: Sleep: Good      Physical Exam  Physical Exam Vitals and nursing note reviewed.  Constitutional:      General: He is not in acute distress.    Appearance: He is not ill-appearing.  HENT:     Head: Normocephalic and atraumatic.  Eyes:     Conjunctiva/sclera: Conjunctivae normal.  Pulmonary:     Effort: Pulmonary effort is normal. No respiratory distress.  Skin:    General: Skin is warm and dry.  Neurological:     General: No focal deficit present.    Review of Systems  All other systems reviewed and are negative.  Blood pressure (!) 158/98, pulse 84, temperature 98.4 F (36.9 C), temperature source Oral, resp. rate 18, SpO2 99%. There is no height or weight on file to calculate BMI.  Treatment Plan Summary: Daily contact with patient to assess and evaluate symptoms and progress in treatment  and Medication management  ASSESSMENT:  Maksim Peregoy is a 58 yo male with a reported past psychiatric history of depression, anxiety, and polysubstance abuse (cocaine, methamphetamines, alcohol, cannabis). PMHx is significant for CLL, arthritis, leukemia, prediabetes, and sickle cell trait, and significant neck and back pain since a forklift accident in 2017.  He presents to [facility] voluntarily requesting detox and transition to residential rehabilitation.  Patient reports no  acute concerns or complaints today.  He is tolerating the Seroquel  without adverse effects.  As doses titrated to maintenance (300 mg), assess for improvement to trauma related nightmares.  If no improvements.  Consider adding prazosin at bedtime.  He poses no safety threats toward himself nor others, denying both passive and active SI today.  Patient is still agreeable to residential substance use treatment, or sober living community if unable to find a treatment facility within his insurance network.  Patient with continued elevated BP today, both systolic and diastolic.  Previously treated with Norvasc , but had been noncompliant PTA.  Will restart due to elevated BP despite single dose of clonidine .  LCSW to continue to follow up on disposition upon return Monday.  Diagnoses / Active Problems: Bipolar 1 disorder, current episode depressed (ruled out substance-induced mania) Stimulant use disorder, cocaine type, methamphetamine type Alcohol use disorder  PLAN: Safety and Monitoring:  --  Voluntary admission to inpatient psychiatric unit for safety, stabilization and treatment  -- Daily contact with patient to assess and evaluate symptoms and progress in treatment  -- Patient's case to be discussed in multi-disciplinary team meeting  -- Observation Level : q15 minute checks  -- Vital signs:  q12 hours  -- Precautions: suicide, elopement, and assault  2. Psychiatric Diagnoses and Treatment:  Increase to Seroquel  200 mg at  bedtime for bipolar depression; will titrate to 300 mg tomorrow night. --  The risks/benefits/side-effects/alternatives to this medication were discussed in detail with the patient and time was given for questions. The patient consents to medication trial.   -- Metabolic profile and EKG monitoring obtained while on an atypical antipsychotic  BMI: 35.95 kg/m Lipid Panel: TG 213, HDL 28 HbgA1c: 6.0 on 06/23/2023 QTc: 418  -- Encouraged patient to participate in unit milieu and in scheduled group therapies     3. Medical Issues Being Addressed:   Patient is recommended to have outpatient medical follow-up for chronic medical conditions including: CLL, prediabetes, arthritis, HTN  #CLL On chart review patient was previously evaluated by [facility], who confirmed diagnosis of CLL.  Patient is favorable diagnosis, has continued to present asymptomatic. -Recommend hematology/oncology referral to continue follow-up  # HTN Patient with history of hypertension managed by Dr. Emmy.  Prescribed Norvasc  5 mg in June 2024.  Patient with hypertensive BP throughout admission (most recent 158/98, manually).  Patient is asymptomatic. -Start Norvasc  5 mg daily -Recommend PCP follow-up upon discharge.  Labs reviewed: Abnormal CBC, consistent with documented diagnosis of CLL (since 2018), significant leukocytosis (90.2 K/uL), lymphocytosis, mild thrombocytopenia CMP showing serum creatinine 1.32 , transient elevations in AST/ALT likely secondary to substance use A1c 6.0%   4. Discharge Planning:   -- Social work and case management to assist with discharge planning and identification of hospital follow-up needs prior to discharge  -- Estimated LOS: 5-7 days  -- Discharge Concerns: Need to establish a safety and substance use treatment plan; Medication compliance and effectiveness    --Disposition: Pending  Signed:  Charmaine Myrtle, MD 06/26/2023 12:38 PM

## 2023-06-26 NOTE — ED Notes (Signed)
Pt fed lunch.

## 2023-06-26 NOTE — Group Note (Signed)
 Group Topic: Identity and Relationships  Group Date: 06/26/2023 Start Time: 1200 End Time: 1220 Facilitators: Armida Adelita RAMAN, LPN  Department: [facility]  Number of Participants: 5  Group Focus: nursing group Treatment Modality:  Patient-Centered Therapy Interventions utilized were support Purpose: enhance coping skills  Name: Alvin Finley Date of Birth: [birth year]  MR: [id number]    Level of Participation: active Quality of Participation: cooperative Interactions with others: gave feedback Mood/Affect: appropriate Triggers (if applicable): n/a Cognition: coherent/clear Progress: Gaining insight Response: appropriate response to group session Plan: follow-up needed  Patients Problems:  Patient Active Problem List   Diagnosis Date Noted   Polysubstance abuse (HCC) 06/23/2023   Screening for colorectal cancer 11/09/2022   Polysubstance use disorder 10/29/2022   Alcohol use 10/29/2022   CLL (chronic lymphocytic leukemia) (HCC) 10/29/2022   Chronic pain 10/29/2022   Encounter to establish care 10/29/2022   Hypertension 10/28/2022

## 2023-06-26 NOTE — ED Notes (Signed)
Pt fed breakfast 

## 2023-06-26 NOTE — ED Notes (Signed)
 Abnormal BP of 158/98 obtained manually. Patient asymptomatic, in no acute distress. Provider Lamar Sprinkles, MD made aware.

## 2023-06-26 NOTE — ED Notes (Signed)
 PRN Atarax given for pt reported anxiety. Medication administered with no complications. Environment secured, safety checks in place per facility policy.

## 2023-06-26 NOTE — ED Notes (Addendum)
 Abnormal BP of 154/96 obtained manually. Patient asymptomatic, in no acute distress. Provider Lamar Sprinkles, MD made aware.

## 2023-06-26 NOTE — ED Notes (Signed)
 Abnormal BP of 149/97 obtained. Patient asymptomatic, in no acute distress. Provider Lamar Sprinkles, MD made aware.

## 2023-06-27 DIAGNOSIS — F1110 Opioid abuse, uncomplicated: Secondary | ICD-10-CM | POA: Diagnosis not present

## 2023-06-27 DIAGNOSIS — F1010 Alcohol abuse, uncomplicated: Secondary | ICD-10-CM | POA: Diagnosis not present

## 2023-06-27 DIAGNOSIS — F1210 Cannabis abuse, uncomplicated: Secondary | ICD-10-CM | POA: Diagnosis not present

## 2023-06-27 DIAGNOSIS — F1510 Other stimulant abuse, uncomplicated: Secondary | ICD-10-CM | POA: Diagnosis not present

## 2023-06-27 LAB — HEMOGLOBIN A1C
Hgb A1c MFr Bld: 5.9 % — ABNORMAL HIGH (ref 4.8–5.6)
Mean Plasma Glucose: 123 mg/dL

## 2023-06-27 NOTE — ED Notes (Signed)
 Patient observed/assessed at nursing station. Patient alert and oriented x 4. Affect is bright. Patient denies pain and anxiety. He denies A/V/H. He denies having any thoughts/plan of self harm and harm towards others.  Patient states that appetite has been good throughout the day. Verbalizes no further complaints at this time. Will continue to monitor and support.

## 2023-06-27 NOTE — ED Notes (Signed)
 Pt sleeping at present, no distress noted.  Monitoring for safety.

## 2023-06-27 NOTE — Discharge Planning (Signed)
 LCSW spoke with patient on this morning to inform him that his options for placement are limited due to his insurance not being accepted at most facilities. Patient has been denied at [facility] due [facility]. [facility], [facility] [place], [facility], and [facility] are not in network with his insurance. LCSW attempted to follow up with [facility] to see if they are in network with his insurance, however received no answer. LCSW went and provided a list of the Sober Living facilities and Longterm options that the patient could follow up with regarding bed availability. Patient expressed understanding that he would need to make phone calls on today to attempt to secure placement for himself. Patient aware that LCSW will follow up at a later time on today for updates. No other needs to report at this time.   Merlynn Lazier, LCSW Clinical Social Worker [facility] Ph: [phone]

## 2023-06-27 NOTE — Group Note (Signed)
 Group Topic: Recovery Basics / AA Group Meeting Group Date: 06/27/2023 Start Time: 1003 End Time: 1105 Facilitators: Brien Can B  Department: [facility]  Number of Participants: 2  Group Focus: self-awareness and substance abuse education Treatment Modality:  Psychoeducation Interventions utilized were group exercise and support Purpose: increase insight and reinforce self-care, relapse pervention  Name: Alvin Finley Date of Birth: Apr 07, 1966  MR: [id number]    Level of Participation: active Quality of Participation: attentive and cooperative Interactions with others: gave feedback Mood/Affect: restless, tired Triggers (if applicable): N/A Cognition: coherent/clear Progress: Moderate Response: N/A Plan: follow-up needed  Patients Problems:  Patient Active Problem List   Diagnosis Date Noted   Polysubstance abuse (HCC) 06/23/2023   Screening for colorectal cancer 11/09/2022   Polysubstance use disorder 10/29/2022   Alcohol use 10/29/2022   CLL (chronic lymphocytic leukemia) (HCC) 10/29/2022   Chronic pain 10/29/2022   Encounter to establish care 10/29/2022   Hypertension 10/28/2022

## 2023-06-27 NOTE — Group Note (Signed)
 Group Topic: Overcoming Obstacles  Group Date: 06/27/2023 Start Time: 2000 End Time: 2045 Facilitators: Joan Plowman B  Department: [facility]  Number of Participants: 1  Group Focus: activities of daily living skills, check in, communication, coping skills, daily focus, feeling awareness/expression, goals/reality orientation, and individual meeting Treatment Modality:  Leisure Development Interventions utilized were assignment, leisure development, problem solving, and support Purpose: express feelings, increase insight, regain self-worth, and reinforce self-care  Name: Alvin Finley Date of Birth: 06/24/1965  MR: [id number]    Level of Participation: active Quality of Participation: attentive, cooperative, motivated, and offered feedback Interactions with others: gave feedback Mood/Affect: appropriate, brightens with interaction, and positive Triggers (if applicable): NA Cognition: coherent/clear Progress: Gaining insight Response: We looked up some numbers for assistants in housing etc. Printed him off a couple pages with numbers to see if his health insurance will cover it.  He said he's been having a hard time today and had spent two- three houses calling around.  Plan: patient will be encouraged to keep attending groups.   Patients Problems:  Patient Active Problem List   Diagnosis Date Noted   Polysubstance abuse (HCC) 06/23/2023   Screening for colorectal cancer 11/09/2022   Polysubstance use disorder 10/29/2022   Alcohol use 10/29/2022   CLL (chronic lymphocytic leukemia) (HCC) 10/29/2022   Chronic pain 10/29/2022   Encounter to establish care 10/29/2022   Hypertension 10/28/2022

## 2023-06-27 NOTE — ED Provider Notes (Signed)
 Behavioral Health Progress Note  Date and Time: 06/27/2023 8:45 AM Name: Alvin Finley MRN:  [id number]  Subjective:   Alvin Finley is a 58 yo male with a reported past psychiatric history of depression, anxiety, and polysubstance abuse (cocaine, methamphetamines, alcohol, cannabis). PMHx is significant for CLL, arthritis, leukemia, prediabetes, and sickle cell trait, and significant neck and back pain since a forklift accident in 2017.  He presents to [facility] voluntarily requesting detox and transition to residential rehabilitation.  Patient seen in his room this morning on my approach. He reports that he is doing fine and he is not experiencing any alcohol withdrawals. He states that he is looking to go to a rehab for substance abuse but he is not sure if his insurance will permit it. If he can't get into a treatment program he plans on returning home.  Substance use history is detailed below, per Dr. Homer: Longest period of sobriety was during certain periods of sobriety while in prison(2011-2018) Alcohol: started at age 23, drinks all day, will use up to 1 gallon daily. Last drink was day prior to arriving to [facility] Denies seizures, denies Dts or hospitlizations. Tobacco: Denies Cannabis:has been using since age 62, uses daily, smokes 2-3 joints, purchased form dealer. Denies delta8/delta9 Cocaine: using since age 80, he reports on/off use he uses in moments of weakness. Last use at hotel prior to arriving here, reports using about $200 dollars worth  Poppers: first started using at 58 yo, reports using infrequently, reports he has enjoyed the hallucinogenic effects Methamphetamines: Using for the past couple of years (about 4 years), reports sporadic use. Spends $100-200 monthly. Psilocybin (mushrooms): Denies Ecstasy (MDMA / molly): no recent use LSD (acid): never tried: Denies Opiates (fentanyl / heroin): Reports he used once heroin when he was living in [place] 1 year ago,  reports he overdosed and required narcan Benzos (Xanax, Klonopin): Denies IV Drug Use Hx: Denies Rx drug abuse: Denies Rehab hx: He has previously been to rehab programs in [place], most recently he was at [place] for detox 3 months ago. Pt reports liefitme total times he has been 5-6 programs. He has been to [facility] previously around 2 years ago.    Past Psychiatric Hx: Current Psychiatrist: Current Therapist: Previous Psychiatric Diagnoses: per pt, depression, anxiety Current psychiatric medications:unsure Psychiatric medication history/compliance: Psychiatric Hospitalization hx: Psychotherapy hx: Neuromodulation history:  History of suicide (obtained from HPI): History of homicide or aggression (obtained in HPI):   Past Medical History: PCP: Medical Dx: CLL, crushing injury of left arm requiring skin grafts, chronic back pain, prediabetes Medications: Denies Allergies: Denies Hospitalizations: Surgeries: Trauma: Patient sustained a left arm fracture from a forklift accident in 2017 Seizures: Denies   Family Medical History: Mostly unknown to patient, believes HTN may run in his family  Family Psychiatric History: Psychiatric Dx: Denies Suicide Hx: Denies Violence/Aggression: Denies Substance use: reports he has 3 brothers that used to have addiction, sister --crack cocaine use  Social History: Living Situation: Currently staying at a hotel the past 3-4 days Social Support: Reports he has  sister in [place]  he can count on, reports they talk daily Education: completed some college courses Occupational hx: Receiving SSI for forklift accident. 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 Marital Status: Single Children: Denies Legal: Denies legal charges or upcoming court dates. Previously incarcerated. Military: Denies  Access to firearms: Denies    Diagnosis:  Final diagnoses:  Polysubstance abuse (HCC)  Bipolar I disorder, most recent episode depressed (HCC)  Stimulant use disorder    Total Time spent with patient: 30 minutes   Current Medications:  Current Facility-Administered Medications  Medication Dose Route Frequency Provider Last Rate Last Admin   acetaminophen  (TYLENOL ) tablet 650 mg  650 mg Oral Q6H PRN McLauchlin, Angela, NP   650 mg at 06/26/23 2054   alum & mag hydroxide-simeth (MAALOX/MYLANTA) 200-200-20 MG/5ML suspension 30 mL  30 mL Oral Q4H PRN McLauchlin, Angela, NP       amLODipine  (NORVASC ) tablet 5 mg  5 mg Oral Daily Rainelle Pfeiffer, MD   5 mg at 06/26/23 1220   haloperidol  (HALDOL ) tablet 5 mg  5 mg Oral Q6H PRN McLauchlin, Jon, NP       And   benztropine  (COGENTIN ) tablet 1 mg  1 mg Oral Q6H PRN McLauchlin, Angela, NP       hydrOXYzine  (ATARAX ) tablet 25 mg  25 mg Oral TID PRN McLauchlin, Angela, NP   25 mg at 06/26/23 2053   magnesium  hydroxide (MILK OF MAGNESIA) suspension 30 mL  30 mL Oral Daily PRN McLauchlin, Jon, NP       multivitamin with minerals tablet 1 tablet  1 tablet Oral Daily McLauchlin, Angela, NP   1 tablet at 06/26/23 9062   QUEtiapine  (SEROQUEL  XR) 24 hr tablet 300 mg  300 mg Oral QHS Rainelle Pfeiffer, MD       thiamine  (VITAMIN B1) tablet 100 mg  100 mg Oral Daily McLauchlin, Angela, NP   100 mg at 06/26/23 9062   traZODone  (DESYREL ) tablet 50 mg  50 mg Oral QHS PRN McLauchlin, Angela, NP   50 mg at 06/25/23 2113   Current Outpatient Medications  Medication Sig Dispense Refill   amLODipine  (NORVASC ) 5 MG tablet Take 1 tablet (5 mg total) by mouth daily. 30 tablet 11   DULoxetine  (CYMBALTA ) 30 MG capsule TAKE 1 CAPSULE BY MOUTH EVERY DAY (Patient not taking: Reported on 06/24/2023) 90 capsule 1   gabapentin (NEURONTIN) 100 MG capsule Take 200 mg by mouth 3 (three) times daily. (Patient not taking: Reported on 06/24/2023)     methocarbamol  (ROBAXIN ) 500 MG tablet Take 500 mg by mouth every 8 (eight) hours as needed for muscle spasms. (Patient  not taking: Reported on 06/24/2023)      Labs  Lab Results:  Admission on 06/23/2023  Component Date Value Ref Range Status   WBC 06/23/2023 90.2 (HH)  4.0 - 10.5 K/uL Final   Comment: REPEATED TO VERIFY THIS CRITICAL RESULT HAS VERIFIED AND BEEN CALLED TO JUDITH FORSON, RN BY JEZREEL LAS IGAN ON 01 03 2025 AT 0432, AND HAS BEEN READ BACK.     RBC 06/23/2023 5.26  4.22 - 5.81 MIL/uL Final   Hemoglobin 06/23/2023 14.8  13.0 - 17.0 g/dL Final   HCT 98/97/7974 48.7  39.0 - 52.0 % Final   MCV 06/23/2023 92.6  80.0 - 100.0 fL Final   MCH 06/23/2023 28.1  26.0 - 34.0 pg Final   MCHC 06/23/2023 30.4  30.0 - 36.0 g/dL Final   RDW 98/97/7974 15.0  11.5 - 15.5 % Final   Platelets 06/23/2023 147 (L)  150 - 400 K/uL Final   REPEATED TO VERIFY   nRBC 06/23/2023 0.0  0.0 - 0.2 % Final   Neutrophils Relative % 06/23/2023 7  % Final   Neutro Abs 06/23/2023 6.7  1.7 - 7.7 K/uL Final   Lymphocytes Relative 06/23/2023 75  % Final   Lymphs  Abs 06/23/2023 67.6 (H)  0.7 - 4.0 K/uL Final   Monocytes Relative 06/23/2023 17  % Final   Monocytes Absolute 06/23/2023 14.9 (H)  0.1 - 1.0 K/uL Final   Eosinophils Relative 06/23/2023 0  % Final   Eosinophils Absolute 06/23/2023 0.3  0.0 - 0.5 K/uL Final   Basophils Relative 06/23/2023 1  % Final   Basophils Absolute 06/23/2023 0.5 (H)  0.0 - 0.1 K/uL Final   Immature Granulocytes 06/23/2023 0  % Final   Abs Immature Granulocytes 06/23/2023 0.30 (H)  0.00 - 0.07 K/uL Final   Polychromasia 06/23/2023 PRESENT   Final   Performed at [facility], [address] [place], [place] [address]   Sodium 06/23/2023 139  135 - 145 mmol/L Final   Potassium 06/23/2023 5.0  3.5 - 5.1 mmol/L Final   HEMOLYSIS AT THIS LEVEL MAY AFFECT RESULT   Chloride 06/23/2023 102  98 - 111 mmol/L Final   CO2 06/23/2023 25  22 - 32 mmol/L Final   Glucose, Bld 06/23/2023 83  70 - 99 mg/dL Final   Glucose reference range applies only to samples taken after fasting for at least 8  hours.   BUN 06/23/2023 20  6 - 20 mg/dL Final   Creatinine, Ser 06/23/2023 1.32 (H)  0.61 - 1.24 mg/dL Final   Calcium 98/97/7974 8.9  8.9 - 10.3 mg/dL Final   Total Protein 98/97/7974 5.8 (L)  6.5 - 8.1 g/dL Final   Albumin 98/97/7974 3.6  3.5 - 5.0 g/dL Final   AST 98/97/7974 79 (H)  15 - 41 U/L Final   HEMOLYSIS AT THIS LEVEL MAY AFFECT RESULT   ALT 06/23/2023 52 (H)  0 - 44 U/L Final   HEMOLYSIS AT THIS LEVEL MAY AFFECT RESULT   Alkaline Phosphatase 06/23/2023 22 (L)  38 - 126 U/L Final   Total Bilirubin 06/23/2023 1.0  0.0 - 1.2 mg/dL Final   HEMOLYSIS AT THIS LEVEL MAY AFFECT RESULT   GFR, Estimated 06/23/2023 >60  >60 mL/min Final   Comment: (NOTE) Calculated using the CKD-EPI Creatinine Equation (2021)    Anion gap 06/23/2023 12  5 - 15 Final   Performed at [facility], [address], [place], [place] [address]   Hgb A1c MFr Bld 06/23/2023 6.0 (H)  4.8 - 5.6 % Final   Comment: (NOTE)         Prediabetes: 5.7 - 6.4         Diabetes: >6.4         Glycemic control for adults with diabetes: <7.0    Mean Plasma Glucose 06/23/2023 126  mg/dL Final   Comment: (NOTE) Performed At: [facility] [address] [place], [place] [id number] Jennette Shorter MD Ey:[id number]    POC Amphetamine UR 06/24/2023 Positive (A)  NONE DETECTED (Cut Off Level 1000 ng/mL) Final   POC Secobarbital (BAR) 06/24/2023 None Detected  NONE DETECTED (Cut Off Level 300 ng/mL) Final   POC Buprenorphine (BUP) 06/24/2023 None Detected  NONE DETECTED (Cut Off Level 10 ng/mL) Final   POC Oxazepam (BZO) 06/24/2023 None Detected  NONE DETECTED (Cut Off Level 300 ng/mL) Final   POC Cocaine UR 06/24/2023 Positive (A)  NONE DETECTED (Cut Off Level 300 ng/mL) Final   POC Methamphetamine UR 06/24/2023 Positive (A)  NONE DETECTED (Cut Off Level 1000 ng/mL) Final   POC Morphine 06/24/2023 None Detected  NONE DETECTED (Cut Off Level 300 ng/mL) Final   POC Methadone UR 06/24/2023 None Detected  NONE  DETECTED (Cut Off Level 300 ng/mL) Final   POC Oxycodone  UR 06/24/2023 None Detected  NONE DETECTED (Cut Off Level 100 ng/mL) Final   POC Marijuana UR 06/24/2023 Positive (A)  NONE DETECTED (Cut Off Level 50 ng/mL) Final   Path Review 06/23/2023  Marked lymphocytosis with smudge cells consistent with chronic lymphocytic leukemia.   Final   Comment: Reviewed by Norleen BIRCH. Belvie, M.D.  06/24/2023 Performed at [facility], [address] [place], [place] [address]    Cholesterol 06/25/2023 117  0 - 200 mg/dL Final   Triglycerides 98/95/7974 213 (H)  <150 mg/dL Final   HDL 98/95/7974 28 (L)  >40 mg/dL Final   Total CHOL/HDL Ratio 06/25/2023 4.2  RATIO Final   VLDL 06/25/2023 43 (H)  0 - 40 mg/dL Final   LDL Cholesterol 06/25/2023 46  0 - 99 mg/dL Final   Comment:        Total Cholesterol/HDL:CHD Risk Coronary Heart Disease Risk Table                     Men   Women  1/2 Average Risk   3.4   3.3  Average Risk       5.0   4.4  2 X Average Risk   9.6   7.1  3 X Average Risk  23.4   11.0        Use the calculated Patient Ratio above and the CHD Risk Table to determine the patient's CHD Risk.        ATP III CLASSIFICATION (LDL):  <100     mg/dL   Optimal  899-870  mg/dL   Near or Above                    Optimal  130-159  mg/dL   Borderline  839-810  mg/dL   High  >809     mg/dL   Very High Performed at [facility], [address] [place], [place] [address]    TSH 06/25/2023 1.503  0.350 - 4.500 uIU/mL Final   Comment: Performed by a 3rd Generation assay with a functional sensitivity of <=0.01 uIU/mL. Performed at [facility], [address] [place], [place] [address]     Blood Alcohol level:  No results found for: [facility]  Metabolic Disorder Labs: Lab Results  Component Value Date   HGBA1C 6.0 (H) 06/23/2023   MPG 126 06/23/2023   No results found for: PROLACTIN Lab Results  Component Value Date   CHOL 117 06/25/2023   TRIG 213 (H) 06/25/2023   HDL  28 (L) 06/25/2023   CHOLHDL 4.2 06/25/2023   VLDL 43 (H) 06/25/2023   LDLCALC 46 06/25/2023    Therapeutic Lab Levels: No results found for: LITHIUM No results found for: VALPROATE No results found for: CBMZ  Physical Findings   PHQ2-9    Flowsheet Row ED from 06/23/2023 in [facility] [address] Office Visit from 10/28/2022 in [facility] [place]  PHQ-2 Total Score 1 2  PHQ-9 Total Score 21 11      Flowsheet Row ED from 06/23/2023 in [facility] Most recent reading at 06/24/2023 12:36 AM ED from 06/23/2023 in [facility] Most recent reading at 06/23/2023  6:27 PM ED from 06/20/2023 in [facility] Emergency Department at [facility] Most recent reading at  06/20/2023 10:13 PM  C-SSRS RISK CATEGORY Low Risk Low Risk No Risk        Musculoskeletal  Strength & Muscle Tone: within normal limits Gait & Station: normal Patient leans: N/A  Psychiatric Specialty Exam  Presentation  General Appearance:  Appropriate for Environment; Casual  Eye Contact: Fair  Speech: Clear and Coherent; Normal Rate  Speech Volume: Normal  Handedness: Left   Mood and Affect  Mood: Anxious; Euthymic   Affect: Appropriate; Congruent   Thought Process  Thought Processes: Coherent  Descriptions of Associations:Intact  Orientation:Full (Time, Place and Person)  Thought Content:Logical; WDL  Diagnosis of Schizophrenia or Schizoaffective disorder in past: No    Hallucinations:Hallucinations: None   Ideas of Reference:None  Suicidal Thoughts:Suicidal Thoughts: No   Homicidal Thoughts:Homicidal Thoughts: No    Sensorium  Memory: Immediate Good; Recent Good  Judgment: Fair  Insight: Fair   Art Therapist  Concentration: Good  Attention Span: Good  Recall: Good  Fund of  Knowledge: Good  Language: Good   Psychomotor Activity  Psychomotor Activity: Psychomotor Activity: Normal    Assets  Assets: Communication Skills; Desire for Improvement   Sleep  Sleep: Sleep: Good      Physical Exam  Physical Exam Vitals and nursing note reviewed.  Constitutional:      General: He is not in acute distress.    Appearance: He is not ill-appearing.  HENT:     Head: Normocephalic and atraumatic.  Eyes:     Conjunctiva/sclera: Conjunctivae normal.  Pulmonary:     Effort: Pulmonary effort is normal. No respiratory distress.  Skin:    General: Skin is warm and dry.  Neurological:     General: No focal deficit present.    Review of Systems  All other systems reviewed and are negative.  Blood pressure (!) 153/103, pulse 84, temperature 98.3 F (36.8 C), temperature source Oral, resp. rate 18, SpO2 100%. There is no height or weight on file to calculate BMI.  Treatment Plan Summary: Daily contact with patient to assess and evaluate symptoms and progress in treatment and Medication management  ASSESSMENT:  Lamoine Magallon is a 58 yo male with a reported past psychiatric history of depression, anxiety, and polysubstance abuse (cocaine, methamphetamines, alcohol, cannabis). PMHx is significant for CLL, arthritis, leukemia, prediabetes, and sickle cell trait, and significant neck and back pain since a forklift accident in 2017.  He presents to [facility] voluntarily requesting detox and transition to residential rehabilitation.  Patient reports no acute concerns or complaints today.  He is tolerating the Seroquel  without adverse effects.  As doses titrated to maintenance (300 mg), assess for improvement to trauma related nightmares.  If no improvements.  Consider adding prazosin at bedtime.  He poses no safety threats toward himself nor others, denying both passive and active SI today.  Patient is still agreeable to residential substance use treatment, or sober  living community if unable to find a treatment facility within his insurance network.  Patient with continued elevated BP today, both systolic and diastolic.  Previously treated with Norvasc , but had been noncompliant PTA.  Will restart due to elevated BP despite single dose of clonidine .  LCSW to continue to follow up on disposition upon return Monday.  Diagnoses / Active Problems: Bipolar 1 disorder, current episode depressed (ruled out substance-induced mania) Stimulant use disorder, cocaine type, methamphetamine type Alcohol use disorder  PLAN: Safety and Monitoring:  --  Voluntary admission to inpatient psychiatric unit for safety, stabilization and treatment  -- Daily  contact with patient to assess and evaluate symptoms and progress in treatment  -- Patient's case to be discussed in multi-disciplinary team meeting  -- Observation Level : q15 minute checks  -- Vital signs:  q12 hours  -- Precautions: suicide, elopement, and assault  2. Psychiatric Diagnoses and Treatment:  Continue Seroquel  300 mg PO QHS --  The risks/benefits/side-effects/alternatives to this medication were discussed in detail with the patient and time was given for questions. The patient consents to medication trial.   -- Metabolic profile and EKG monitoring obtained while on an atypical antipsychotic  BMI: 35.95 kg/m Lipid Panel: TG 213, HDL 28 HbgA1c: 6.0 on 06/23/2023 QTc: 418  -- Encouraged patient to participate in unit milieu and in scheduled group therapies     3. Medical Issues Being Addressed:   Patient is recommended to have outpatient medical follow-up for chronic medical conditions including: CLL, prediabetes, arthritis, HTN  #CLL On chart review patient was previously evaluated by [facility], who confirmed diagnosis of CLL.  Patient is favorable diagnosis, has continued to present asymptomatic. -Recommend hematology/oncology referral to continue follow-up  # HTN Patient with  history of hypertension managed by Dr. Emmy.  Prescribed Norvasc  5 mg in June 2024.  Patient with hypertensive BP throughout admission (most recent 158/98, manually).  Patient is asymptomatic. -Start Norvasc  5 mg daily -Recommend PCP follow-up upon discharge.  Labs reviewed: Abnormal CBC, consistent with documented diagnosis of CLL (since 2018), significant leukocytosis (90.2 K/uL), lymphocytosis, mild thrombocytopenia CMP showing serum creatinine 1.32 , transient elevations in AST/ALT likely secondary to substance use A1c 6.0%   4. Discharge Planning:   -- Social work and case management to assist with discharge planning and identification of hospital follow-up needs prior to discharge  -- Estimated LOS: 5-7 days  -- Discharge Concerns: Need to establish a safety and substance use treatment plan; Medication compliance and effectiveness    --Disposition: Pending  Signed:  Kriya Westra L Shekia Kuper, DO 06/27/2023 8:45 AM

## 2023-06-27 NOTE — ED Notes (Signed)
 Patient is sleeping. Respirations equal and unlabored, skin warm and dry. No change in assessment or acuity. Routine safety checks conducted according to facility protocol. Will continue to monitor for safety.

## 2023-06-27 NOTE — Progress Notes (Signed)
 Patient is currently in his room on his bed with his eyes closed.  Breathing is even and unlabored.

## 2023-06-28 DIAGNOSIS — F1010 Alcohol abuse, uncomplicated: Secondary | ICD-10-CM | POA: Diagnosis not present

## 2023-06-28 DIAGNOSIS — F1510 Other stimulant abuse, uncomplicated: Secondary | ICD-10-CM | POA: Diagnosis not present

## 2023-06-28 DIAGNOSIS — F1210 Cannabis abuse, uncomplicated: Secondary | ICD-10-CM | POA: Diagnosis not present

## 2023-06-28 DIAGNOSIS — F1110 Opioid abuse, uncomplicated: Secondary | ICD-10-CM | POA: Diagnosis not present

## 2023-06-28 MED ORDER — VITAMIN B-1 100 MG PO TABS: 100.0000 mg | ORAL_TABLET | Freq: Every day | ORAL | Status: AC

## 2023-06-28 MED ORDER — ADULT MULTIVITAMIN W/MINERALS CH: 1.0000 | ORAL_TABLET | Freq: Every day | ORAL | Status: AC

## 2023-06-28 MED ORDER — QUETIAPINE FUMARATE ER 300 MG PO TB24: 300.0000 mg | ORAL_TABLET | Freq: Every day | ORAL | 0 refills | Status: AC

## 2023-06-28 MED ORDER — TRAZODONE HCL 50 MG PO TABS: 50.0000 mg | ORAL_TABLET | Freq: Every evening | ORAL | 0 refills | Status: AC | PRN

## 2023-06-28 NOTE — ED Notes (Signed)
 Patient is sleeping. Respirations equal and unlabored, skin warm and dry. No change in assessment or acuity. Routine safety checks conducted according to facility protocol. Will continue to monitor for safety.

## 2023-06-28 NOTE — Group Note (Signed)
 Group Topic: Change and Accountability  Group Date: 06/28/2023 Start Time: 1200 End Time: 1230 Facilitators: Gerlean Leeroy HERO, RN  Department: [facility]  Number of Participants: 3  Group Focus: activities of daily living skills Treatment Modality:  Behavior Modification Therapy Interventions utilized were clarification Purpose: improve communication skills  Name: Alvin Finley Date of Birth: 12-Nov-1965  MR: [id number]    Level of Participation: active Quality of Participation: attentive and cooperative Interactions with others: gave feedback Mood/Affect: appropriate Triggers (if applicable): being around family Cognition: coherent/clear Progress: Significant Response: engaged Plan: patient will be encouraged to stay away from the people and places that are his triggers to using  Patients Problems:  Patient Active Problem List   Diagnosis Date Noted   Polysubstance abuse (HCC) 06/23/2023   Screening for colorectal cancer 11/09/2022   Polysubstance use disorder 10/29/2022   Alcohol use 10/29/2022   CLL (chronic lymphocytic leukemia) (HCC) 10/29/2022   Chronic pain 10/29/2022   Encounter to establish care 10/29/2022   Hypertension 10/28/2022

## 2023-06-28 NOTE — Discharge Planning (Signed)
 LCSW was informed by patient that he has been offered a bed at [facility] and reports he needs to be there by 4:00pm. LCSW contacted [facility] and spoke with Mrs. Lola who reports she has offered the patient a bed on today and he would need to arrive by 4:00pm. Mrs. Erle reports patient needs to be independent with all ADLS. LCSW confirmed that patient has no limitations. Facility aware that transportation will be arranged for patient to arrive by 4:00pm. No other needs were reported by agency.   LCSW informed MD and Team. RN has scheduled transport for the patient. Resources added in AVS for the patient's follow up. No other needs to reported. LCSW to sign off at this time. Please inform if further LCSW needs arise prior to discharge.   Merlynn Lazier, LCSW Clinical Social Worker [facility] Ph: [phone]

## 2023-06-28 NOTE — ED Notes (Signed)
 Patient reports no issues overnight.  He endorses his chronic back pain and received Prn Tylenol  with his regularly scheduled morning medications.  He is calm and cooperative, and reports to be anxious over where he will go when he is discharged from this facility.

## 2023-06-28 NOTE — Group Note (Signed)
 Group Topic: Change and Accountability  Group Date: 06/27/2023 Start Time: 1300 End Time: 1330 Facilitators: Gerlean Leeroy HERO, RN  Department: [facility]  Number of Participants: 3  Group Focus: activities of daily living skills Treatment Modality:  Skills Training Interventions utilized were patient education Purpose: enhance coping skills  Name: Alvin Finley Date of Birth: 08/24/1965  MR: [id number]    Level of Participation: active Quality of Participation: attentive Interactions with others: gave feedback Mood/Affect: appropriate Triggers (if applicable): N/A Cognition: coherent/clear Progress: Gaining insight Response: engaged Plan: patient will be encouraged to implement new skills in daily activities  Patients Problems:  Patient Active Problem List   Diagnosis Date Noted   Polysubstance abuse (HCC) 06/23/2023   Screening for colorectal cancer 11/09/2022   Polysubstance use disorder 10/29/2022   Alcohol use 10/29/2022   CLL (chronic lymphocytic leukemia) (HCC) 10/29/2022   Chronic pain 10/29/2022   Encounter to establish care 10/29/2022   Hypertension 10/28/2022

## 2023-06-28 NOTE — ED Provider Notes (Signed)
 Behavioral Health Progress Note  Date and Time: 06/28/2023 12:29 PM Name: Alvin Finley MRN:  [id number]  Subjective:   Alvin Finley is a 58 yo male with a reported past psychiatric history of depression, anxiety, and polysubstance abuse (cocaine, methamphetamines, alcohol, cannabis). PMHx is significant for CLL, arthritis, leukemia, prediabetes, and sickle cell trait, and significant neck and back pain since a forklift accident in 2017.  During his evaluation here, patient met criteria for bipolar 1 disorder.  He presents to [facility] voluntarily requesting detox and transition to residential rehabilitation.  Per LCSW: LCSW went and provided a list of the Sober Living facilities and Longterm options that the patient could follow up with regarding bed availability. Patient expressed understanding that he would need to make phone calls on today to attempt to secure placement for himself.  Patient evaluated on the unit.  Reported adequate sleep and appetite.  He has been working hard contacting facilities, patient was initially accepted at [facility] for [facility] today.  However, per Representative at [facility] over the phone, they cannot accept patients on Seroquel  or gabapentin.  Patient also expressed concerns about having his clothing at the hotel he has been staying at.  LCSW was present for this discussion, and we ultimately agreed patient should continue to contact [facility] so that he can continue on his scheduled Seroquel , especially since patient reports benefiting from this medication for his bipolar depression.  Patient was also encouraged to contact hotel so that his clothing could be kept for him while he is hospitalized.  Patient is in agreement with this plan.  On interview, suicidal ideations are not present. Thoughts of self harm are not present. Homicidal ideations are not present.   There are no auditory hallucinations, visual hallucinations, paranoid ideations,  or delusional thought processes.   Side effects to currently prescribed medications are none. There are no somatic complaints. Reports regular bowel movements.. There are no withdrawals reported today.   Substance use history is detailed below: Longest period of sobriety was during certain periods of sobriety while in prison(2011-2018) Alcohol: started at age 55, drinks all day, will use up to 1 gallon daily. Last drink was day prior to arriving to [facility] Denies seizures, denies Dts or hospitlizations. Tobacco: Denies Cannabis:has been using since age 45, uses daily, smokes 2-3 joints, purchased form dealer. Denies delta8/delta9 Cocaine: using since age 16, he reports on/off use he uses in moments of weakness. Last use at hotel prior to arriving here, reports using about $200 dollars worth  Poppers: first started using at 58 yo, reports using infrequently, reports he has enjoyed the hallucinogenic effects Methamphetamines: Using for the past couple of years (about 4 years), reports sporadic use. Spends $100-200 monthly. Psilocybin (mushrooms): Denies Ecstasy (MDMA / molly): no recent use LSD (acid): never tried: Denies Opiates (fentanyl / heroin): Reports he used once heroin when he was living in [place] 1 year ago, reports he overdosed and required narcan Benzos (Xanax, Klonopin): Denies IV Drug Use Hx: Denies Rx drug abuse: Denies Rehab hx: He has previously been to rehab programs in [place], most recently he was at [facility] for detox 3 months ago. Pt reports liefitme total times he has been 5-6 programs. He has been to [facility] previously around 2 years ago.    Past Psychiatric Hx: Current Psychiatrist: Current Therapist: Previous Psychiatric Diagnoses: per pt, depression, anxiety Current psychiatric medications:unsure Psychiatric medication history/compliance: Psychiatric Hospitalization hx: Psychotherapy hx: Neuromodulation history:  History of suicide (obtained from HPI):  History of  homicide or aggression (obtained in HPI):   Past Medical History: PCP: Medical Dx: CLL, crushing injury of left arm requiring skin grafts, chronic back pain, prediabetes Medications: Denies Allergies: Denies Hospitalizations: Surgeries: Trauma: Patient sustained a left arm fracture from a forklift accident in 2017 Seizures: Denies   Family Medical History: Mostly unknown to patient, believes HTN may run in his family  Family Psychiatric History: Psychiatric Dx: Denies Suicide Hx: Denies Violence/Aggression: Denies Substance use: reports he has 3 brothers that used to have addiction, sister --crack cocaine use  Social History: Living Situation: Currently staying at a hotel the past 3-4 days Social Support: Reports he has  sister in [place]  he can count on, reports they talk daily Education: completed some college courses Occupational hx: Receiving SSI for forklift accident. 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 Marital Status: Single Children: Denies Legal: Denies legal charges or upcoming court dates. Previously incarcerated. Military: Denies  Access to firearms: Denies    Diagnosis:  Final diagnoses:  Polysubstance abuse (HCC)  Bipolar I disorder, most recent episode depressed (HCC)  Stimulant use disorder    Total Time spent with patient: 30 minutes   Current Medications:  Current Facility-Administered Medications  Medication Dose Route Frequency Provider Last Rate Last Admin   acetaminophen  (TYLENOL ) tablet 650 mg  650 mg Oral Q6H PRN McLauchlin, Angela, NP   650 mg at 06/28/23 0943   alum & mag hydroxide-simeth (MAALOX/MYLANTA) 200-200-20 MG/5ML suspension 30 mL  30 mL Oral Q4H PRN McLauchlin, Angela, NP       amLODipine  (NORVASC ) tablet 5 mg  5 mg Oral Daily Rainelle Pfeiffer, MD   5 mg at 06/28/23 0944   haloperidol  (HALDOL ) tablet 5 mg  5 mg Oral Q6H PRN McLauchlin, Angela, NP       And    benztropine  (COGENTIN ) tablet 1 mg  1 mg Oral Q6H PRN McLauchlin, Angela, NP       hydrOXYzine  (ATARAX ) tablet 25 mg  25 mg Oral TID PRN McLauchlin, Angela, NP   25 mg at 06/27/23 9070   magnesium  hydroxide (MILK OF MAGNESIA) suspension 30 mL  30 mL Oral Daily PRN McLauchlin, Jon, NP       multivitamin with minerals tablet 1 tablet  1 tablet Oral Daily McLauchlin, Angela, NP   1 tablet at 06/28/23 0944   QUEtiapine  (SEROQUEL  XR) 24 hr tablet 300 mg  300 mg Oral QHS Rainelle Pfeiffer, MD   300 mg at 06/27/23 2107   thiamine  (VITAMIN B1) tablet 100 mg  100 mg Oral Daily McLauchlin, Angela, NP   100 mg at 06/28/23 0944   traZODone  (DESYREL ) tablet 50 mg  50 mg Oral QHS PRN McLauchlin, Angela, NP   50 mg at 06/27/23 2107   Current Outpatient Medications  Medication Sig Dispense Refill   amLODipine  (NORVASC ) 5 MG tablet Take 1 tablet (5 mg total) by mouth daily. 30 tablet 11   DULoxetine  (CYMBALTA ) 30 MG capsule TAKE 1 CAPSULE BY MOUTH EVERY DAY (Patient not taking: Reported on 06/24/2023) 90 capsule 1   gabapentin (NEURONTIN) 100 MG capsule Take 200 mg by mouth 3 (three) times daily. (Patient not taking: Reported on 06/24/2023)     methocarbamol  (ROBAXIN ) 500 MG tablet Take 500 mg by mouth every 8 (eight) hours as needed for muscle spasms. (Patient not taking: Reported on 06/24/2023)      Labs  Lab Results:  Admission on 06/23/2023  Component Date  Value Ref Range Status   WBC 06/23/2023 90.2 (HH)  4.0 - 10.5 K/uL Final   Comment: REPEATED TO VERIFY THIS CRITICAL RESULT HAS VERIFIED AND BEEN CALLED TO JUDITH FORSON, RN BY JEZREEL LAS IGAN ON 01 03 2025 AT 0432, AND HAS BEEN READ BACK.     RBC 06/23/2023 5.26  4.22 - 5.81 MIL/uL Final   Hemoglobin 06/23/2023 14.8  13.0 - 17.0 g/dL Final   HCT 98/97/7974 48.7  39.0 - 52.0 % Final   MCV 06/23/2023 92.6  80.0 - 100.0 fL Final   MCH 06/23/2023 28.1  26.0 - 34.0 pg Final   MCHC 06/23/2023 30.4  30.0 - 36.0 g/dL Final   RDW 98/97/7974 15.0  11.5 - 15.5  % Final   Platelets 06/23/2023 147 (L)  150 - 400 K/uL Final   REPEATED TO VERIFY   nRBC 06/23/2023 0.0  0.0 - 0.2 % Final   Neutrophils Relative % 06/23/2023 7  % Final   Neutro Abs 06/23/2023 6.7  1.7 - 7.7 K/uL Final   Lymphocytes Relative 06/23/2023 75  % Final   Lymphs Abs 06/23/2023 67.6 (H)  0.7 - 4.0 K/uL Final   Monocytes Relative 06/23/2023 17  % Final   Monocytes Absolute 06/23/2023 14.9 (H)  0.1 - 1.0 K/uL Final   Eosinophils Relative 06/23/2023 0  % Final   Eosinophils Absolute 06/23/2023 0.3  0.0 - 0.5 K/uL Final   Basophils Relative 06/23/2023 1  % Final   Basophils Absolute 06/23/2023 0.5 (H)  0.0 - 0.1 K/uL Final   Immature Granulocytes 06/23/2023 0  % Final   Abs Immature Granulocytes 06/23/2023 0.30 (H)  0.00 - 0.07 K/uL Final   Polychromasia 06/23/2023 PRESENT   Final   Performed at [facility], [address] [place], [place] [address]   Sodium 06/23/2023 139  135 - 145 mmol/L Final   Potassium 06/23/2023 5.0  3.5 - 5.1 mmol/L Final   HEMOLYSIS AT THIS LEVEL MAY AFFECT RESULT   Chloride 06/23/2023 102  98 - 111 mmol/L Final   CO2 06/23/2023 25  22 - 32 mmol/L Final   Glucose, Bld 06/23/2023 83  70 - 99 mg/dL Final   Glucose reference range applies only to samples taken after fasting for at least 8 hours.   BUN 06/23/2023 20  6 - 20 mg/dL Final   Creatinine, Ser 06/23/2023 1.32 (H)  0.61 - 1.24 mg/dL Final   Calcium 98/97/7974 8.9  8.9 - 10.3 mg/dL Final   Total Protein 98/97/7974 5.8 (L)  6.5 - 8.1 g/dL Final   Albumin 98/97/7974 3.6  3.5 - 5.0 g/dL Final   AST 98/97/7974 79 (H)  15 - 41 U/L Final   HEMOLYSIS AT THIS LEVEL MAY AFFECT RESULT   ALT 06/23/2023 52 (H)  0 - 44 U/L Final   HEMOLYSIS AT THIS LEVEL MAY AFFECT RESULT   Alkaline Phosphatase 06/23/2023 22 (L)  38 - 126 U/L Final   Total Bilirubin 06/23/2023 1.0  0.0 - 1.2 mg/dL Final   HEMOLYSIS AT THIS LEVEL MAY AFFECT RESULT   GFR, Estimated 06/23/2023 >60  >60 mL/min Final   Comment:  (NOTE) Calculated using the CKD-EPI Creatinine Equation (2021)    Anion gap 06/23/2023 12  5 - 15 Final   Performed at [facility], [address] [place], [place] [address]   Hgb A1c MFr Bld 06/23/2023 6.0 (H)  4.8 - 5.6 % Final   Comment: (NOTE)  Prediabetes: 5.7 - 6.4         Diabetes: >6.4         Glycemic control for adults with diabetes: <7.0    Mean Plasma Glucose 06/23/2023 126  mg/dL Final   Comment: (NOTE) Performed At: [facility] [address] [place], [place] [id number] Jennette Shorter MD Ey:[id number]    POC Amphetamine UR 06/24/2023 Positive (A)  NONE DETECTED (Cut Off Level 1000 ng/mL) Final   POC Secobarbital (BAR) 06/24/2023 None Detected  NONE DETECTED (Cut Off Level 300 ng/mL) Final   POC Buprenorphine (BUP) 06/24/2023 None Detected  NONE DETECTED (Cut Off Level 10 ng/mL) Final   POC Oxazepam (BZO) 06/24/2023 None Detected  NONE DETECTED (Cut Off Level 300 ng/mL) Final   POC Cocaine UR 06/24/2023 Positive (A)  NONE DETECTED (Cut Off Level 300 ng/mL) Final   POC Methamphetamine UR 06/24/2023 Positive (A)  NONE DETECTED (Cut Off Level 1000 ng/mL) Final   POC Morphine 06/24/2023 None Detected  NONE DETECTED (Cut Off Level 300 ng/mL) Final   POC Methadone UR 06/24/2023 None Detected  NONE DETECTED (Cut Off Level 300 ng/mL) Final   POC Oxycodone  UR 06/24/2023 None Detected  NONE DETECTED (Cut Off Level 100 ng/mL) Final   POC Marijuana UR 06/24/2023 Positive (A)  NONE DETECTED (Cut Off Level 50 ng/mL) Final   Path Review 06/23/2023  Marked lymphocytosis with smudge cells consistent with chronic lymphocytic leukemia.   Final   Comment: Reviewed by Norleen BIRCH. Belvie, M.D.  06/24/2023 Performed at [facility], [address], [place], [place] [address]    Hgb A1c MFr Bld 06/25/2023 5.9 (H)  4.8 - 5.6 % Final   Comment: (NOTE)         Prediabetes: 5.7 - 6.4         Diabetes: >6.4         Glycemic control for adults with diabetes: <7.0    Mean  Plasma Glucose 06/25/2023 123  mg/dL Final   Comment: (NOTE) Performed At: [facility] [address] [place], [place] [id number] Jennette Shorter MD Ey:[id number]    Cholesterol 06/25/2023 117  0 - 200 mg/dL Final   Triglycerides 98/95/7974 213 (H)  <150 mg/dL Final   HDL 98/95/7974 28 (L)  >40 mg/dL Final   Total CHOL/HDL Ratio 06/25/2023 4.2  RATIO Final   VLDL 06/25/2023 43 (H)  0 - 40 mg/dL Final   LDL Cholesterol 06/25/2023 46  0 - 99 mg/dL Final   Comment:        Total Cholesterol/HDL:CHD Risk Coronary Heart Disease Risk Table                     Men   Women  1/2 Average Risk   3.4   3.3  Average Risk       5.0   4.4  2 X Average Risk   9.6   7.1  3 X Average Risk  23.4   11.0        Use the calculated Patient Ratio above and the CHD Risk Table to determine the patient's CHD Risk.        ATP III CLASSIFICATION (LDL):  <100     mg/dL   Optimal  899-870  mg/dL   Near or Above                    Optimal  130-159  mg/dL   Borderline  839-810  mg/dL   High  >  190     mg/dL   Very High Performed at [facility], [address], [place], [place] [address]    TSH 06/25/2023 1.503  0.350 - 4.500 uIU/mL Final   Comment: Performed by a 3rd Generation assay with a functional sensitivity of <=0.01 uIU/mL. Performed at [facility], [address], [place], [place] [address]     Blood Alcohol level:  No results found for: [facility]  Metabolic Disorder Labs: Lab Results  Component Value Date   HGBA1C 5.9 (H) 06/25/2023   MPG 123 06/25/2023   MPG 126 06/23/2023   No results found for: PROLACTIN Lab Results  Component Value Date   CHOL 117 06/25/2023   TRIG 213 (H) 06/25/2023   HDL 28 (L) 06/25/2023   CHOLHDL 4.2 06/25/2023   VLDL 43 (H) 06/25/2023   LDLCALC 46 06/25/2023    Therapeutic Lab Levels: No results found for: LITHIUM No results found for: VALPROATE No results found for: CBMZ  Physical Findings   PHQ2-9    Flowsheet Row ED  from 06/23/2023 in [facility] Office Visit from 10/28/2022 in [facility] [place]  PHQ-2 Total Score 1 2  PHQ-9 Total Score 21 11      Flowsheet Row ED from 06/23/2023 in [facility] Most recent reading at 06/24/2023 12:36 AM ED from 06/23/2023 in [facility] Most recent reading at 06/23/2023  6:27 PM ED from 06/20/2023 in [facility] Emergency Department at [facility] Most recent reading at 06/20/2023 10:13 PM  C-SSRS RISK CATEGORY Low Risk Low Risk No Risk        Musculoskeletal  Strength & Muscle Tone: within normal limits Gait & Station: normal Patient leans: N/A  Psychiatric Specialty Exam  Presentation  General Appearance:  Appropriate for Environment  Eye Contact: Good  Speech: Clear and Coherent  Speech Volume: Normal  Handedness: Left   Mood and Affect  Mood: Euthymic   Affect: Appropriate   Thought Process  Thought Processes: Coherent  Descriptions of Associations:Intact  Orientation:Full (Time, Place and Person)  Thought Content:Logical  Diagnosis of Schizophrenia or Schizoaffective disorder in past: No    Hallucinations:Hallucinations: None   Ideas of Reference:None  Suicidal Thoughts:Suicidal Thoughts: No   Homicidal Thoughts:Homicidal Thoughts: No    Sensorium  Memory: Immediate Good; Recent Good  Judgment: Good  Insight: Good   Executive Functions  Concentration: Good  Attention Span: Good  Recall: Good  Fund of Knowledge: Fair  Language: Good   Psychomotor Activity  Psychomotor Activity: No data recorded    Assets  Assets: Communication Skills; Desire for Improvement   Sleep  Sleep: Sleep: Good      Physical Exam  Physical Exam Vitals and nursing note reviewed.  Constitutional:      General: He is not in acute distress.    Appearance: He is not  ill-appearing.  HENT:     Head: Normocephalic and atraumatic.  Eyes:     Conjunctiva/sclera: Conjunctivae normal.  Pulmonary:     Effort: Pulmonary effort is normal. No respiratory distress.  Skin:    General: Skin is warm and dry.  Neurological:     General: No focal deficit present.    Review of Systems  All other systems reviewed and are negative.  Blood pressure 129/88, pulse 86, temperature 98.7 F (37.1 C), temperature source Oral, resp. rate 16, SpO2 100%. There  is no height or weight on file to calculate BMI.  Treatment Plan Summary: Daily contact with patient to assess and evaluate symptoms and progress in treatment and Medication management  ASSESSMENT:  Alvin Finley is a 58 yo male with a reported past psychiatric history of depression, anxiety, and polysubstance abuse (cocaine, methamphetamines, alcohol, cannabis). PMHx is significant for CLL, arthritis, leukemia, prediabetes, and sickle cell trait, and significant neck and back pain since a forklift accident in 2017.  He presents to [facility] voluntarily requesting detox and transition to residential rehabilitation.  Patient reports no acute concerns or complaints today.  He is tolerating the Seroquel  without adverse effects.  As doses titrated to maintenance (300 mg), assess for improvement to trauma related nightmares.  If no improvements.  Consider adding prazosin at bedtime.  He poses no safety threats toward himself nor others, denying both passive and active SI today.  Patient is still agreeable to residential substance use treatment, or sober living community if unable to find a treatment facility within his insurance network.  Patient with continued elevated BP today, both systolic and diastolic.  Previously treated with Norvasc , but had been noncompliant PTA.  Will restart due to elevated BP despite single dose of clonidine .  LCSW to continue to follow up on disposition upon return Monday.  Diagnoses / Active  Problems: Bipolar 1 disorder, current episode depressed (ruled out substance-induced mania) Stimulant use disorder, cocaine type, methamphetamine type Alcohol use disorder  PLAN: Safety and Monitoring:  --  Voluntary admission to inpatient psychiatric unit for safety, stabilization and treatment  -- Daily contact with patient to assess and evaluate symptoms and progress in treatment  -- Patient's case to be discussed in multi-disciplinary team meeting  -- Observation Level : q15 minute checks  -- Vital signs:  q12 hours  -- Precautions: suicide, elopement, and assault  2. Psychiatric Diagnoses and Treatment:  Continue Seroquel  300 mg PO QHS --  The risks/benefits/side-effects/alternatives to this medication were discussed in detail with the patient and time was given for questions. The patient consents to medication trial.   -- Metabolic profile and EKG monitoring obtained while on an atypical antipsychotic  BMI: 35.95 kg/m Lipid Panel: TG 213, HDL 28 HbgA1c: 6.0 on 06/23/2023 QTc: 418  -- Encouraged patient to participate in unit milieu and in scheduled group therapies     3. Medical Issues Being Addressed:   Patient is recommended to have outpatient medical follow-up for chronic medical conditions including: CLL, prediabetes, arthritis, HTN  #CLL On chart review patient was previously evaluated by [facility], who confirmed diagnosis of CLL.  Patient is favorable diagnosis, has continued to present asymptomatic. -Recommend hematology/oncology referral to continue follow-up  # HTN Patient with history of hypertension managed by Dr. Emmy.  Prescribed Norvasc  5 mg in June 2024.  Patient with hypertensive BP throughout admission (most recent 158/98, manually).  Patient is asymptomatic. -Start Norvasc  5 mg daily -Recommend PCP follow-up upon discharge.  Labs reviewed: Abnormal CBC, consistent with documented diagnosis of CLL (since 2018), significant  leukocytosis (90.2 K/uL), lymphocytosis, mild thrombocytopenia CMP showing serum creatinine 1.32 , transient elevations in AST/ALT likely secondary to substance use A1c 6.0%   4. Discharge Planning:   -- Social work and case management to assist with discharge planning and identification of hospital follow-up needs prior to discharge  -- Estimated LOS: to be determined  -- Discharge Concerns: Need to establish a safety and substance use treatment plan; Medication compliance and effectiveness  Disposition: Pending  Signed:  Marlo Masson, MD 06/28/2023 12:29 PM

## 2023-06-28 NOTE — ED Notes (Signed)
 Pt was provided breakfast.

## 2023-06-28 NOTE — ED Notes (Signed)
 Pt was provided lunch

## 2023-06-28 NOTE — ED Notes (Signed)
 Patient completed requested survey upon discharge.

## 2023-06-28 NOTE — Progress Notes (Signed)
 Patient has a bed at [facility] [place], [place] - located at [address]. [phone].  Safe transport has been contacted and will arrive in 30-40 minutes in order for patient to be there by 4pm.

## 2023-06-28 NOTE — ED Provider Notes (Signed)
 FBC/OBS ASAP Discharge Summary  Date and Time: 06/28/2023 12:51 PM  Name: Alvin Finley  MRN:  [id number]   Discharge Diagnoses:  Final diagnoses:  Polysubstance abuse (HCC)  Bipolar I disorder, most recent episode depressed (HCC)  Stimulant use disorder    Subjective:  Alvin Finley is a 58 yo male with a reported past psychiatric history of depression, anxiety, and polysubstance abuse (cocaine, methamphetamines, alcohol, cannabis). PMHx is significant for CLL, arthritis, leukemia, prediabetes, and sickle cell trait, and significant neck and back pain since a forklift accident in 2017.  During his evaluation here, patient met criteria for bipolar 1 disorder.  He presents to [facility] voluntarily requesting detox and transition to residential rehabilitation.   Per LCSW: LCSW went and provided a list of the Sober Living facilities and Longterm options that the patient could follow up with regarding bed availability. Patient expressed understanding that he would need to make phone calls on today to attempt to secure placement for himself.   Patient evaluated on the unit.  Reported adequate sleep and appetite.  He has been working hard contacting facilities, patient was initially accepted at [facility] for discharge today.  However, per Representative at [facility] over the phone, they cannot accept patients on Seroquel  or gabapentin.  Patient also expressed concerns about having his clothing at the hotel he has been staying at.  LCSW was present for this discussion, and we ultimately agreed patient should continue to contact [facility] so that he can continue on his scheduled Seroquel , especially since patient reports benefiting from this medication for his bipolar depression.  Patient was also encouraged to contact hotel so that his clothing could be kept for him while he is hospitalized.  Patient is in agreement with this plan.   On interview, suicidal ideations are not present.  Thoughts of self harm are not present. Homicidal ideations are not present.    There are no auditory hallucinations, visual hallucinations, paranoid ideations, or delusional thought processes.    Side effects to currently prescribed medications are none. There are no somatic complaints. Reports regular bowel movements.. There are no withdrawals reported today.   Stay Summary:  During the patient's hospitalization at the [facility], patient had extensive initial psychiatric evaluation, with daily follow-up assessments focused on detoxification management.  Psychiatric diagnoses provided upon initial assessment:  Polysubstance abuse Bipolar 1 disorder most recent episode depressed Stimulant use disorder, cocaine type, amphetamine type  Patient's medications adjusted during hospitalization:  Patient was started on Seroquel  which was titrated up to 300 mg nightly for bipolar depression  Patient's care was discussed during the interdisciplinary team meeting every day during the hospitalization.  The patient denies having side effects to prescribed psychiatric medication.  Gradually, patient started adjusting to milieu. The patient was evaluated each day by a clinical provider to ascertain response to treatment. Improvement was noted by the patient's report of decreasing symptoms, improved sleep and appetite, affect, medication tolerance, behavior, and participation in unit programming.  Patient was asked each day to complete a self inventory noting mood, mental status, pain, new symptoms, anxiety and concerns.    Symptoms were reported as significantly decreased or resolved completely by discharge.   On day of discharge, the patient reports that their mood is stable. The patient denied having suicidal thoughts for more than 48 hours prior to discharge.  Patient denies having homicidal thoughts.  Patient denies having auditory hallucinations.  Patient denies any visual hallucinations or other symptoms of  psychosis. The patient  was motivated to continue taking medication with a goal of continued improvement in mental health.   The patient reported that their withdrawal symptoms and cravings responded well to the detox regimen, with overall benefit from the detox program. Supportive psychotherapy was provided, and the patient participated in regular group therapy sessions focused on managing cravings and withdrawal. Coping skills, problem-solving, and relaxation techniques were also part of the program's therapeutic interventions.  Labs were reviewed with the patient, and abnormal results were discussed with the patient.  The patient is able to verbalize their individual safety plan to this provider.  # It is recommended to the patient to continue psychiatric medications as prescribed, after discharge from the hospital.    # It is recommended to the patient to follow up with your outpatient psychiatric provider and PCP.  # It was discussed with the patient, the impact of alcohol, drugs, tobacco have been there overall psychiatric and medical wellbeing, and total abstinence from substance use was recommended the patient.ed.  # Prescriptions provided or sent directly to preferred pharmacy at discharge. Patient agreeable to plan. Given opportunity to ask questions. Appears to feel comfortable with discharge.    # In the event of worsening symptoms, the patient is instructed to call the crisis hotline, 911 and or go to the nearest ED for appropriate evaluation and treatment of symptoms. To follow-up with primary care provider for other medical issues, concerns and or health care needs  # Patient was discharged [facility] with a plan to follow up as noted below.     Total Time spent with patient: 30 minutes  Substance use history is detailed below: Longest period of sobriety was during certain periods of sobriety while in prison(2011-2018) Alcohol: started at age 39, drinks all day, will use  up to 1 gallon daily. Last drink was day prior to arriving to [facility] Denies seizures, denies Dts or hospitlizations. Tobacco: Denies Cannabis:has been using since age 1, uses daily, smokes 2-3 joints, purchased form dealer. Denies delta8/delta9 Cocaine: using since age 6, he reports on/off use he uses in moments of weakness. Last use at hotel prior to arriving here, reports using about $200 dollars worth  Poppers: first started using at 58 yo, reports using infrequently, reports he has enjoyed the hallucinogenic effects Methamphetamines: Using for the past couple of years (about 4 years), reports sporadic use. Spends $100-200 monthly. Psilocybin (mushrooms): Denies Ecstasy (MDMA / molly): no recent use LSD (acid): never tried: Denies Opiates (fentanyl / heroin): Reports he used once heroin when he was living in [place] 1 year ago, reports he overdosed and required narcan Benzos (Xanax, Klonopin): Denies IV Drug Use Hx: Denies Rx drug abuse: Denies Rehab hx: He has previously been to rehab programs in [place], most recently he was at [facility] for detox 3 months ago. Pt reports liefitme total times he has been 5-6 programs. He has been to [facility] previously around 2 years ago.    Past Psychiatric Hx: Current Psychiatrist:Denies Current Therapist:Denies Previous Psychiatric Diagnoses: per pt, depression, anxiety Current psychiatric medications:unsure    Past Medical History: Medical Dx: CLL, crushing injury of left arm requiring skin grafts, chronic back pain, prediabetes Medications: Denies Allergies: Denies Hospitalizations: Surgeries: Trauma: Patient sustained a left arm fracture from a forklift accident in 2017 Seizures: Denies     Family Medical History: Mostly unknown to patient, believes HTN may run in his family   Family Psychiatric History: Psychiatric Dx: Denies Suicide Hx: Denies Violence/Aggression: Denies Substance use: reports  he has 3 brothers that used to have addiction,  sister --crack cocaine use   Social History: Living Situation: Currently staying at a hotel the past 3-4 days Social Support: Reports he has  sister in [place]  he can count on, reports they talk daily Education: completed some college courses Occupational hx: Receiving SSI for forklift accident. 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 Marital Status: Single Children: Denies Legal: Denies legal charges or upcoming court dates. Previously incarcerated. Military: Denies   Access to firearms: Denies   Current Medications:  Current Facility-Administered Medications  Medication Dose Route Frequency Provider Last Rate Last Admin   acetaminophen  (TYLENOL ) tablet 650 mg  650 mg Oral Q6H PRN McLauchlin, Angela, NP   650 mg at 06/28/23 0943   alum & mag hydroxide-simeth (MAALOX/MYLANTA) 200-200-20 MG/5ML suspension 30 mL  30 mL Oral Q4H PRN McLauchlin, Angela, NP       amLODipine  (NORVASC ) tablet 5 mg  5 mg Oral Daily Rainelle Pfeiffer, MD   5 mg at 06/28/23 0944   haloperidol  (HALDOL ) tablet 5 mg  5 mg Oral Q6H PRN McLauchlin, Angela, NP       And   benztropine  (COGENTIN ) tablet 1 mg  1 mg Oral Q6H PRN McLauchlin, Angela, NP       hydrOXYzine  (ATARAX ) tablet 25 mg  25 mg Oral TID PRN McLauchlin, Angela, NP   25 mg at 06/27/23 9070   magnesium  hydroxide (MILK OF MAGNESIA) suspension 30 mL  30 mL Oral Daily PRN McLauchlin, Jon, NP       multivitamin with minerals tablet 1 tablet  1 tablet Oral Daily McLauchlin, Angela, NP   1 tablet at 06/28/23 0944   QUEtiapine  (SEROQUEL  XR) 24 hr tablet 300 mg  300 mg Oral QHS Rainelle Pfeiffer, MD   300 mg at 06/27/23 2107   thiamine  (VITAMIN B1) tablet 100 mg  100 mg Oral Daily McLauchlin, Angela, NP   100 mg at 06/28/23 0944   traZODone  (DESYREL ) tablet 50 mg  50 mg Oral QHS PRN McLauchlin, Angela, NP   50 mg at 06/27/23 2107   Current Outpatient Medications  Medication Sig Dispense Refill   [START  ON 06/29/2023] Multiple Vitamin (MULTIVITAMIN WITH MINERALS) TABS tablet Take 1 tablet by mouth daily.     QUEtiapine  (SEROQUEL  XR) 300 MG 24 hr tablet Take 1 tablet (300 mg total) by mouth at bedtime. 30 tablet 0   [START ON 06/29/2023] thiamine  (VITAMIN B-1) 100 MG tablet Take 1 tablet (100 mg total) by mouth daily.     traZODone  (DESYREL ) 50 MG tablet Take 1 tablet (50 mg total) by mouth at bedtime as needed for sleep. 30 tablet 0    PTA Medications:  Facility Ordered Medications  Medication   acetaminophen  (TYLENOL ) tablet 650 mg   alum & mag hydroxide-simeth (MAALOX/MYLANTA) 200-200-20 MG/5ML suspension 30 mL   magnesium  hydroxide (MILK OF MAGNESIA) suspension 30 mL   [COMPLETED] thiamine  (VITAMIN B1) injection 100 mg   thiamine  (VITAMIN B1) tablet 100 mg   multivitamin with minerals tablet 1 tablet   [EXPIRED] LORazepam  (ATIVAN ) tablet 1 mg   [EXPIRED] hydrOXYzine  (ATARAX ) tablet 25 mg   [EXPIRED] loperamide  (IMODIUM ) capsule 2-4 mg   [EXPIRED] ondansetron  (ZOFRAN -ODT) disintegrating tablet 4 mg   hydrOXYzine  (ATARAX ) tablet 25 mg   traZODone  (DESYREL ) tablet 50 mg   haloperidol  (HALDOL ) tablet 5 mg   And   benztropine  (COGENTIN ) tablet 1 mg   [COMPLETED] cloNIDine  (  CATAPRES ) tablet 0.1 mg   [COMPLETED] QUEtiapine  (SEROQUEL  XR) 24 hr tablet 200 mg   Followed by   QUEtiapine  (SEROQUEL  XR) 24 hr tablet 300 mg   amLODipine  (NORVASC ) tablet 5 mg   PTA Medications  Medication Sig   [START ON 06/29/2023] Multiple Vitamin (MULTIVITAMIN WITH MINERALS) TABS tablet Take 1 tablet by mouth daily.   [START ON 06/29/2023] thiamine  (VITAMIN B-1) 100 MG tablet Take 1 tablet (100 mg total) by mouth daily.   QUEtiapine  (SEROQUEL  XR) 300 MG 24 hr tablet Take 1 tablet (300 mg total) by mouth at bedtime.   traZODone  (DESYREL ) 50 MG tablet Take 1 tablet (50 mg total) by mouth at bedtime as needed for sleep.       06/26/2023   11:50 AM 06/23/2023   11:26 PM 10/28/2022    2:25 PM  Depression screen PHQ  2/9  Decreased Interest 1 3 0  Down, Depressed, Hopeless 0 3 2  PHQ - 2 Score 1 6 2   Altered sleeping  3 3  Tired, decreased energy  3 2  Change in appetite  0 0  Feeling bad or failure about yourself   3 1  Trouble concentrating  3 3  Moving slowly or fidgety/restless  1 0  Suicidal thoughts  2 0  PHQ-9 Score  21 11  Difficult doing work/chores  Somewhat difficult Somewhat difficult    Flowsheet Row ED from 06/23/2023 in [facility] Most recent reading at 06/24/2023 12:36 AM ED from 06/23/2023 in [facility] Most recent reading at 06/23/2023  6:27 PM ED from 06/20/2023 in [facility] Emergency Department at [facility] Most recent reading at 06/20/2023 10:13 PM  C-SSRS RISK CATEGORY Low Risk Low Risk No Risk       Musculoskeletal  Strength & Muscle Tone: within normal limits Gait & Station: normal Patient leans: N/A  Psychiatric Specialty Exam  Presentation  General Appearance:  Appropriate for Environment  Eye Contact: Good  Speech: Clear and Coherent  Speech Volume: Normal  Handedness: Left   Mood and Affect  Mood: Euthymic  Affect: Appropriate   Thought Process  Thought Processes: Coherent  Descriptions of Associations:Intact  Orientation:Full (Time, Place and Person)  Thought Content:Logical  Diagnosis of Schizophrenia or Schizoaffective disorder in past: No    Hallucinations:Hallucinations: None  Ideas of Reference:None  Suicidal Thoughts:Suicidal Thoughts: No  Homicidal Thoughts:Homicidal Thoughts: No   Sensorium  Memory: Immediate Good; Recent Good  Judgment: Good  Insight: Good   Executive Functions  Concentration: Good  Attention Span: Good  Recall: Good  Fund of Knowledge: Fair  Language: Good   Psychomotor Activity  Psychomotor Activity:No data recorded  Assets  Assets: Communication Skills; Desire for Improvement   Sleep  Sleep: Sleep:  Good   No data recorded  Physical Exam  Physical Exam Vitals and nursing note reviewed.  Constitutional:      General: He is not in acute distress.    Appearance: He is not ill-appearing.  HENT:     Head: Normocephalic and atraumatic.  Pulmonary:     Effort: Pulmonary effort is normal. No respiratory distress.  Skin:    General: Skin is warm and dry.  Neurological:     General: No focal deficit present.    Review of Systems  All other systems reviewed and are negative.  Blood pressure 129/88, pulse 86, temperature 98.7 F (37.1 C), temperature source Oral, resp. rate 16, SpO2 100%. There is no height  or weight on file to calculate BMI.  Demographic Factors:  Male and Low socioeconomic status  Loss Factors: Financial problems/change in socioeconomic status  Historical Factors: NA  Risk Reduction Factors:   NA  Continued Clinical Symptoms:  Alcohol/Substance Abuse/Dependencies  Cognitive Features That Contribute To Risk:  None    Suicide Risk:  Mild:  Suicidal ideation of limited frequency, intensity, duration, and specificity.  There are no identifiable plans, no associated intent, mild dysphoria and related symptoms, good self-control (both objective and subjective assessment), few other risk factors, and identifiable protective factors, including available and accessible social support.  Plan Of Care/Follow-up recommendations:  Activity: as tolerated  Diet: heart healthy  Other: -Follow-up with your outpatient psychiatric provider -instructions on appointment date, time, and address (location) are provided to you in discharge paperwork.  -Take your psychiatric medications as prescribed at discharge - instructions are provided to you in the discharge paperwork  -If you are prescribed an atypical antipsychotic medication, we recommend that your outpatient psychiatrist follow routine screening for side effects within 3 months of discharge, including monitoring:  AIMS scale, height, weight, blood pressure, fasting lipid panel, HbA1c, and fasting blood sugar.   -Recommend total abstinence from alcohol, tobacco, and other illicit drug use at discharge.   -If your psychiatric symptoms recur, worsen, or if you have side effects to your psychiatric medications, call your outpatient psychiatric provider, 911, [phone] or go to the nearest emergency department.  -If suicidal thoughts occur, immediately call your outpatient psychiatric provider, 911, [phone] or go to the nearest emergency department.   Disposition: [employer], MD 06/28/2023, 12:51 PM

## 2023-06-28 NOTE — Group Note (Signed)
 Group Topic: Recovery Basics  Group Date: 06/28/2023 Start Time: 0800 End Time: 0830 Facilitators: Judi Monico RAMAN, NT  Department: [facility]  Number of Participants: 4  Group Focus: check in Treatment Modality:  Psychoeducation Interventions utilized were exploration, patient education, and support Purpose: express feelings and increase insight  Name: Alvin Finley Date of Birth: [birth year]  MR: [id number]    Level of Participation: moderate Quality of Participation: attentive and cooperative Interactions with others: gave feedback Mood/Affect: appropriate, anxious Triggers (if applicable): N/a Cognition: coherent/clear Progress: Gaining insight Response: Pt was able to share his goals for the day, he expressed his anxiousness towards being discharged. Plan: patient will be encouraged to attend future groups.  Patients Problems:  Patient Active Problem List   Diagnosis Date Noted   Polysubstance abuse (HCC) 06/23/2023   Screening for colorectal cancer 11/09/2022   Polysubstance use disorder 10/29/2022   Alcohol use 10/29/2022   CLL (chronic lymphocytic leukemia) (HCC) 10/29/2022   Chronic pain 10/29/2022   Encounter to establish care 10/29/2022   Hypertension 10/28/2022

## 2023-06-28 NOTE — Discharge Planning (Signed)
 LCSW and MD spoke with patient on this morning regarding disposition plan. Patient reports he has made multiple phone calls to multiple facilities with no success of placement. Patient reports he just recently contacted [facility] and was informed that he could come to their program, however he would not be able to take the Seroquel  medication he is on. LCSW followed up with the facility to confirm the information provided and per Admissions Coordinator Geni, patients are not allowed to be on seroquel  or gabapentin at their program. Per Geni, they are willing to accept the patient before 4:00pm today if he would like to accept bed offer. LCSW informed Mrs. Geni that the patient would need to be monitored further if his meds were going to be switched. Their next admission date is not until Sunday. MD and LCSW advised that the patient continue his placement search as current option does not seem to be the best option for his current situation. Patient reports that he would need to go and get his items from the hotel he was residing in regardless of where he goes. LCSW and MD explored if patient could contact the hotel he was living in to see if they would hold his items until he is able to get to the facility to obtain then. Patient to call and confirm. Patient was advised by LCSW to contact the list of [facility] provided on this morning and patient expressed understanding. Patient aware that he will be provided more time to make phone calls on today with hopes he can secure placement on today. No other needs to report at this time.   LCSW will continue to follow and provide support to patient while on [facility] unit.   Merlynn Lazier, LCSW Clinical Social Worker [facility] Ph: [phone]

## 2023-06-29 ENCOUNTER — Inpatient Hospital Stay: Payer: Medicaid Other | Admitting: Nurse Practitioner

## 2023-06-29 ENCOUNTER — Inpatient Hospital Stay: Payer: Medicaid Other | Attending: Nurse Practitioner

## 2023-06-29 NOTE — Progress Notes (Deleted)
 Patient Care Team: Elicia Sharper, DO as PCP - General   CHIEF COMPLAINT: Follow up CLL  Oncology History  CLL (chronic lymphocytic leukemia) (HCC)  10/29/2022 Initial Diagnosis   CLL (chronic lymphocytic leukemia) (HCC)   11/25/2022 Cancer Staging   Staging form: Chronic Lymphocytic Leukemia / Small Lymphocytic Lymphoma, AJCC 8th Edition - Clinical stage from 11/25/2022: Modified Rai Stage 0 (Modified Rai risk: Low, Binet: Stage A, Lymphocytosis: Present, Adenopathy: Absent, Organomegaly: Absent, Anemia: Absent, Thrombocytopenia: Absent) - Signed by Federico Norleen ONEIDA MADISON, MD on 11/25/2022 Stage prefix: Initial diagnosis Absolute lymphocyte count (ALC) (cells/uL): 102      CURRENT THERAPY: Surveillance   INTERVAL HISTORY Mr. Bolls returns for follow up as scheduled. Last seen by Dr. Federico 11/25/22  ROS   Past Medical History:  Diagnosis Date   Leukemia (HCC)      No past surgical history on file.   Outpatient Encounter Medications as of 06/29/2023  Medication Sig   Multiple Vitamin (MULTIVITAMIN WITH MINERALS) TABS tablet Take 1 tablet by mouth daily.   QUEtiapine  (SEROQUEL  XR) 300 MG 24 hr tablet Take 1 tablet (300 mg total) by mouth at bedtime.   thiamine  (VITAMIN B-1) 100 MG tablet Take 1 tablet (100 mg total) by mouth daily.   traZODone  (DESYREL ) 50 MG tablet Take 1 tablet (50 mg total) by mouth at bedtime as needed for sleep.   No facility-administered encounter medications on file as of 06/29/2023.     There were no vitals filed for this visit. There is no height or weight on file to calculate BMI.   PHYSICAL EXAM GENERAL:alert, no distress and comfortable SKIN: no rash  EYES: sclera clear NECK: without mass LYMPH:  no palpable cervical or supraclavicular lymphadenopathy  LUNGS: clear with normal breathing effort HEART: regular rate & rhythm, no lower extremity edema ABDOMEN: abdomen soft, non-tender and normal bowel sounds NEURO: alert & oriented x 3 with fluent  speech, no focal motor/sensory deficits Breast exam:  PAC without erythema    CBC    Component Value Date/Time   WBC 90.2 (HH) 06/23/2023 2314   RBC 5.26 06/23/2023 2314   HGB 14.8 06/23/2023 2314   HGB 15.9 11/25/2022 0949   HGB 14.5 10/28/2022 1616   HCT 48.7 06/23/2023 2314   HCT 47.4 10/28/2022 1616   PLT 147 (L) 06/23/2023 2314   PLT 196 11/25/2022 0949   PLT 225 10/28/2022 1616   MCV 92.6 06/23/2023 2314   MCV 88 10/28/2022 1616   MCH 28.1 06/23/2023 2314   MCHC 30.4 06/23/2023 2314   RDW 15.0 06/23/2023 2314   RDW 14.5 10/28/2022 1616   LYMPHSABS 67.6 (H) 06/23/2023 2314   LYMPHSABS 114.0 (H) 10/28/2022 1616   MONOABS 14.9 (H) 06/23/2023 2314   EOSABS 0.3 06/23/2023 2314   EOSABS 0.3 10/28/2022 1616   BASOSABS 0.5 (H) 06/23/2023 2314   BASOSABS 0.3 (H) 10/28/2022 1616     CMP     Component Value Date/Time   NA 139 06/23/2023 2314   NA 141 10/28/2022 1616   K 5.0 06/23/2023 2314   CL 102 06/23/2023 2314   CO2 25 06/23/2023 2314   GLUCOSE 83 06/23/2023 2314   BUN 20 06/23/2023 2314   BUN 16 10/28/2022 1616   CREATININE 1.32 (H) 06/23/2023 2314   CREATININE 1.46 (H) 11/25/2022 0949   CALCIUM 8.9 06/23/2023 2314   PROT 5.8 (L) 06/23/2023 2314   PROT 6.7 10/28/2022 1616   ALBUMIN 3.6 06/23/2023 2314  ALBUMIN 4.5 10/28/2022 1616   AST 79 (H) 06/23/2023 2314   AST 24 11/25/2022 0949   ALT 52 (H) 06/23/2023 2314   ALT 17 11/25/2022 0949   ALKPHOS 22 (L) 06/23/2023 2314   BILITOT 1.0 06/23/2023 2314   BILITOT 0.5 11/25/2022 0949   GFRNONAA >60 06/23/2023 2314   GFRNONAA 56 (L) 11/25/2022 0949     ASSESSMENT & PLAN: 59 year old male  Chronic lymphocytic leukemia RAI stage 0 -Dr. 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 Federico  -Additional studies showed monoclonal B-cell population favoring CLL, trisomy +12 associated with an intermediate prognosis (no evidence of p53, 13 q. or ATM deletion) ZAP-70 positive, CD38 negative, CD4 90 negative; elevated LDH 220  PLAN:  No orders of the defined types were placed in this encounter.     All questions were answered. The patient knows to call the clinic with any problems, questions or concerns. No barriers to learning were detected. I spent *** counseling the patient face to face. The total time spent in the appointment was *** and more than 50% was on counseling, review of test results, and coordination of care.   Estel Tonelli, NP-C @DATE @

## 2023-07-11 ENCOUNTER — Telehealth: Payer: Self-pay

## 2023-07-11 NOTE — Transitions of Care (Post Inpatient/ED Visit) (Signed)
   07/11/2023  Name: Alvin Finley MRN: [id number] DOB: 12/05/1965  Today's TOC FU Call Status: Today's TOC FU Call Status:: Successful TOC FU Call Completed TOC FU Call Complete Date: 07/11/23  Attempted to reach the patient regarding the most recent Inpatient/ED visit.  Follow Up Plan: No further outreach attempts will be made at this time. We have been unable to contact the patient. Per patient , he is no longer our patient Signature Karena Addison, LPN [facility] Nurse Health Advisor Direct Dial [phone]

## 2023-11-30 ENCOUNTER — Telehealth: Payer: Self-pay

## 2023-11-30 NOTE — Transitions of Care (Post Inpatient/ED Visit) (Signed)
   11/30/2023  Name: Alvin Finley MRN: [id number] DOB: 05-07-1966  Today's TOC FU Call Status: Today's TOC FU Call Status:: Successful TOC FU Call Completed TOC FU Call Complete Date: 11/30/23 Patient's Name and Date of Birth confirmed.  Transition Care Management Follow-up Telephone Call Date of Discharge: 11/29/23 Discharge Facility: Other [facility]) Name of Other (Non[facility]) Discharge Facility: [facility] Type of Discharge: Inpatient Admission Primary Inpatient Discharge Diagnosis:: fx tibia, depression How have you been since you were released from the hospital?: Better  Items Reviewed: Did you receive and understand the discharge instructions provided?: No Medications obtained,verified, and reconciled?: Yes (Medications Reviewed) Any new allergies since your discharge?: No Dietary orders reviewed?: Yes Do you have support at home?: No  Medications Reviewed Today: Medications Reviewed Today     Reviewed by Darrall Ellison, LPN (Licensed Practical Nurse) on 11/30/23 at 1015  Med List Status: <None>   Medication Order Taking? Sig Documenting Provider Last Dose Status Informant  Multiple Vitamin (MULTIVITAMIN WITH MINERALS) TABS tablet [id number]  Take 1 tablet by mouth daily. Carrion-Carrero, Jacalyn Martin, MD  Active   QUEtiapine  (SEROQUEL  XR) 300 MG 24 hr tablet [id number]  Take 1 tablet (300 mg total) by mouth at bedtime. Carrion-Carrero, Jacalyn Martin, MD  Active   thiamine  (VITAMIN B-1) 100 MG tablet [id number]  Take 1 tablet (100 mg total) by mouth daily. Carrion-Carrero, Jacalyn Martin, MD  Active   traZODone  (DESYREL ) 50 MG tablet [id number]  Take 1 tablet (50 mg total) by mouth at bedtime as needed for sleep. Baltazar Bonier, MD  Active             Home Care and Equipment/Supplies: Were Home Health Services Ordered?: Yes Name of Home Health Agency:: unknown Has Agency set up a time to come to your home?: No EMR reviewed for Home Health Orders: Orders  present/patient has not received call (refer to CM for follow-up) Any new equipment or medical supplies ordered?: NA  Functional Questionnaire: Do you need assistance with bathing/showering or dressing?: Yes Do you need assistance with meal preparation?: No Do you need assistance with eating?: No Do you have difficulty maintaining continence: No Do you need assistance with getting out of bed/getting out of a chair/moving?: No Do you have difficulty managing or taking your medications?: No  Follow up appointments reviewed: Specialist Hospital Follow-up appointment confirmed?: Yes Date of Specialist follow-up appointment?: 12/27/23 Follow-Up Specialty Provider:: ortho Do you need transportation to your follow-up appointment?: No Do you understand care options if your condition(s) worsen?: Yes-patient verbalized understanding    SIGNATURE Darrall Ellison, LPN [facility] Nurse Health Advisor Direct Dial [phone]

## 2024-04-24 ENCOUNTER — Other Ambulatory Visit: Payer: Self-pay

## 2024-04-24 ENCOUNTER — Encounter (HOSPITAL_COMMUNITY): Payer: Self-pay

## 2024-04-24 ENCOUNTER — Emergency Department (HOSPITAL_COMMUNITY)
Admission: EM | Admit: 2024-04-24 | Discharge: 2024-04-24 | Discharge status: Against Medical Advice | Attending: Emergency Medicine | Admitting: Emergency Medicine

## 2024-04-24 DIAGNOSIS — Z5321 Procedure and treatment not carried out due to patient leaving prior to being seen by health care provider: Secondary | ICD-10-CM | POA: Insufficient documentation

## 2024-04-24 DIAGNOSIS — M79602 Pain in left arm: Secondary | ICD-10-CM | POA: Diagnosis not present

## 2024-04-24 DIAGNOSIS — M79675 Pain in left toe(s): Secondary | ICD-10-CM | POA: Diagnosis not present

## 2024-04-24 DIAGNOSIS — M79671 Pain in right foot: Secondary | ICD-10-CM | POA: Insufficient documentation

## 2024-04-24 DIAGNOSIS — M79604 Pain in right leg: Secondary | ICD-10-CM | POA: Diagnosis not present

## 2024-04-24 DIAGNOSIS — M79605 Pain in left leg: Secondary | ICD-10-CM | POA: Diagnosis not present

## 2024-04-24 DIAGNOSIS — M542 Cervicalgia: Secondary | ICD-10-CM | POA: Diagnosis present
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
## 2024-04-24 NOTE — ED Notes (Signed)
 Patient seen leaving the out of 1 room.  Tried to stop patient to see if he was going to stay or leave and patient continued to walk out and leave ER lobby.

## 2024-04-24 NOTE — ED Notes (Signed)
 Pt got up and walked out of triage. Tried to interact with pt and ask where he was going. Pt did not respond and continued walking out front door.
